# Patient Record
Sex: Male | Born: 1965 | Race: White | Hispanic: No | Smoking: Former smoker
Health system: Southern US, Community
[De-identification: ages and names within clinical notes are randomized; demographics above are authoritative.]

## PROBLEM LIST (undated history)

## (undated) DIAGNOSIS — N200 Calculus of kidney: Secondary | ICD-10-CM

## (undated) DIAGNOSIS — Z9289 Personal history of other medical treatment: Secondary | ICD-10-CM

## (undated) DIAGNOSIS — K219 Gastro-esophageal reflux disease without esophagitis: Secondary | ICD-10-CM

## (undated) DIAGNOSIS — N183 Chronic kidney disease, stage 3 unspecified: Secondary | ICD-10-CM

## (undated) DIAGNOSIS — I82402 Acute embolism and thrombosis of unspecified deep veins of left lower extremity: Secondary | ICD-10-CM

## (undated) DIAGNOSIS — C3490 Malignant neoplasm of unspecified part of unspecified bronchus or lung: Secondary | ICD-10-CM

## (undated) DIAGNOSIS — Z974 Presence of external hearing-aid: Secondary | ICD-10-CM

## (undated) DIAGNOSIS — I10 Essential (primary) hypertension: Secondary | ICD-10-CM

## (undated) DIAGNOSIS — M1990 Unspecified osteoarthritis, unspecified site: Secondary | ICD-10-CM

## (undated) DIAGNOSIS — D649 Anemia, unspecified: Secondary | ICD-10-CM

## (undated) HISTORY — DX: Gastro-esophageal reflux disease without esophagitis: K21.9

## (undated) HISTORY — DX: Malignant neoplasm of unspecified part of unspecified bronchus or lung: C34.90

## (undated) HISTORY — DX: Anemia, unspecified: D64.9

## (undated) HISTORY — PX: OTHER SURGICAL HISTORY: SHX169

## (undated) HISTORY — DX: Essential (primary) hypertension: I10

## (undated) HISTORY — PX: INGUINAL HERNIA REPAIR: SUR1180

## (undated) HISTORY — DX: Calculus of kidney: N20.0

---

## 2006-10-10 ENCOUNTER — Ambulatory Visit: Payer: Self-pay | Admitting: Emergency Medicine

## 2010-08-13 ENCOUNTER — Ambulatory Visit: Payer: Self-pay | Admitting: Internal Medicine

## 2013-03-02 ENCOUNTER — Ambulatory Visit: Payer: Self-pay | Admitting: Family Medicine

## 2013-03-14 ENCOUNTER — Ambulatory Visit: Payer: Self-pay | Admitting: Specialist

## 2013-03-14 LAB — CBC WITH DIFFERENTIAL/PLATELET
BASOS ABS: 0.1 10*3/uL (ref 0.0–0.1)
BASOS PCT: 0.9 %
EOS PCT: 0.3 %
Eosinophil #: 0 10*3/uL (ref 0.0–0.7)
HCT: 28 % — ABNORMAL LOW (ref 40.0–52.0)
HGB: 9 g/dL — ABNORMAL LOW (ref 13.0–18.0)
Lymphocyte #: 1.3 10*3/uL (ref 1.0–3.6)
Lymphocyte %: 10.5 %
MCH: 25.7 pg — ABNORMAL LOW (ref 26.0–34.0)
MCHC: 32.2 g/dL (ref 32.0–36.0)
MCV: 80 fL (ref 80–100)
Monocyte #: 1.4 x10 3/mm — ABNORMAL HIGH (ref 0.2–1.0)
Monocyte %: 11.5 %
NEUTROS PCT: 76.8 %
Neutrophil #: 9.4 10*3/uL — ABNORMAL HIGH (ref 1.4–6.5)
PLATELETS: 577 10*3/uL — AB (ref 150–440)
RBC: 3.51 10*6/uL — ABNORMAL LOW (ref 4.40–5.90)
RDW: 15.8 % — ABNORMAL HIGH (ref 11.5–14.5)
WBC: 12.3 10*3/uL — ABNORMAL HIGH (ref 3.8–10.6)

## 2013-03-14 LAB — PROTIME-INR
INR: 1.2
Prothrombin Time: 14.6 secs (ref 11.5–14.7)

## 2013-03-17 ENCOUNTER — Ambulatory Visit: Payer: Self-pay | Admitting: Oncology

## 2013-03-17 LAB — COMPREHENSIVE METABOLIC PANEL
ALBUMIN: 2.1 g/dL — AB (ref 3.4–5.0)
ALT: 15 U/L (ref 12–78)
ANION GAP: 10 (ref 7–16)
Alkaline Phosphatase: 209 U/L — ABNORMAL HIGH
BUN: 11 mg/dL (ref 7–18)
Bilirubin,Total: 0.3 mg/dL (ref 0.2–1.0)
Calcium, Total: 8.5 mg/dL (ref 8.5–10.1)
Chloride: 95 mmol/L — ABNORMAL LOW (ref 98–107)
Co2: 27 mmol/L (ref 21–32)
Creatinine: 1.07 mg/dL (ref 0.60–1.30)
EGFR (Non-African Amer.): >60
GLUCOSE: 119 mg/dL — AB (ref 65–99)
Osmolality: 265 (ref 275–301)
Potassium: 3.6 mmol/L (ref 3.5–5.1)
SGOT(AST): 20 U/L (ref 15–37)
Sodium: 132 mmol/L — ABNORMAL LOW (ref 136–145)
Total Protein: 7.9 g/dL (ref 6.4–8.2)

## 2013-03-17 LAB — CBC CANCER CENTER
BASOS PCT: 0.7 %
Basophil #: 0.1 x10 3/mm (ref 0.0–0.1)
EOS ABS: 0 x10 3/mm (ref 0.0–0.7)
Eosinophil %: 0.4 %
HCT: 28.4 % — AB (ref 40.0–52.0)
HGB: 9.2 g/dL — AB (ref 13.0–18.0)
Lymphocyte #: 1.2 x10 3/mm (ref 1.0–3.6)
Lymphocyte %: 12.3 %
MCH: 25.7 pg — AB (ref 26.0–34.0)
MCHC: 32.5 g/dL (ref 32.0–36.0)
MCV: 79 fL — ABNORMAL LOW (ref 80–100)
MONO ABS: 1.2 x10 3/mm — AB (ref 0.2–1.0)
Monocyte %: 12.2 %
NEUTROS ABS: 7.1 x10 3/mm — AB (ref 1.4–6.5)
Neutrophil %: 74.4 %
Platelet: 571 x10 3/mm — ABNORMAL HIGH (ref 150–440)
RBC: 3.58 10*6/uL — AB (ref 4.40–5.90)
RDW: 15.9 % — ABNORMAL HIGH (ref 11.5–14.5)
WBC: 9.6 x10 3/mm (ref 3.8–10.6)

## 2013-03-18 ENCOUNTER — Ambulatory Visit: Payer: Self-pay | Admitting: Specialist

## 2013-03-18 LAB — BETA HCG QUANT (REF LAB)

## 2013-03-18 LAB — CEA: CEA: 2.6 ng/mL (ref 0.0–4.7)

## 2013-03-18 LAB — AFP TUMOR MARKER: AFP-Tumor Marker: 3.4 ng/mL (ref 0.0–8.3)

## 2013-03-27 ENCOUNTER — Ambulatory Visit: Payer: Self-pay | Admitting: Oncology

## 2013-04-01 LAB — COMPREHENSIVE METABOLIC PANEL
ALBUMIN: 2.1 g/dL — AB (ref 3.4–5.0)
ALT: 12 U/L (ref 12–78)
Alkaline Phosphatase: 219 U/L — ABNORMAL HIGH
Anion Gap: 5 — ABNORMAL LOW (ref 7–16)
BUN: 12 mg/dL (ref 7–18)
Bilirubin,Total: 0.5 mg/dL (ref 0.2–1.0)
Calcium, Total: 9.3 mg/dL (ref 8.5–10.1)
Chloride: 97 mmol/L — ABNORMAL LOW (ref 98–107)
Co2: 28 mmol/L (ref 21–32)
Creatinine: 1.1 mg/dL (ref 0.60–1.30)
EGFR (African American): >60
EGFR (Non-African Amer.): >60
GLUCOSE: 108 mg/dL — AB (ref 65–99)
OSMOLALITY: 265 (ref 275–301)
POTASSIUM: 3.7 mmol/L (ref 3.5–5.1)
SGOT(AST): 19 U/L (ref 15–37)
Sodium: 132 mmol/L — ABNORMAL LOW (ref 136–145)
Total Protein: 8.4 g/dL — ABNORMAL HIGH (ref 6.4–8.2)

## 2013-04-01 LAB — APTT: Activated PTT: 40 secs — ABNORMAL HIGH (ref 23.6–35.9)

## 2013-04-01 LAB — CBC CANCER CENTER
Basophil #: 0.1 x10 3/mm (ref 0.0–0.1)
Basophil %: 0.9 %
Eosinophil #: 0 x10 3/mm (ref 0.0–0.7)
Eosinophil %: 0.1 %
HCT: 27 % — ABNORMAL LOW (ref 40.0–52.0)
HGB: 9.1 g/dL — ABNORMAL LOW (ref 13.0–18.0)
LYMPHS ABS: 1.4 x10 3/mm (ref 1.0–3.6)
Lymphocyte %: 12.6 %
MCH: 26.9 pg (ref 26.0–34.0)
MCHC: 33.8 g/dL (ref 32.0–36.0)
MCV: 80 fL (ref 80–100)
MONO ABS: 0.9 x10 3/mm (ref 0.2–1.0)
Monocyte %: 8.5 %
NEUTROS ABS: 8.5 x10 3/mm — AB (ref 1.4–6.5)
NEUTROS PCT: 77.9 %
Platelet: 604 x10 3/mm — ABNORMAL HIGH (ref 150–440)
RBC: 3.39 10*6/uL — ABNORMAL LOW (ref 4.40–5.90)
RDW: 16.2 % — AB (ref 11.5–14.5)
WBC: 10.9 x10 3/mm — ABNORMAL HIGH (ref 3.8–10.6)

## 2013-04-01 LAB — PROTIME-INR
INR: 1.2
PROTHROMBIN TIME: 14.6 s (ref 11.5–14.7)

## 2013-04-06 ENCOUNTER — Ambulatory Visit: Payer: Self-pay

## 2013-04-15 LAB — COMPREHENSIVE METABOLIC PANEL
ALBUMIN: 2.1 g/dL — AB (ref 3.4–5.0)
ALK PHOS: 262 U/L — AB
ALT: 14 U/L (ref 12–78)
Anion Gap: 13 (ref 7–16)
BUN: 14 mg/dL (ref 7–18)
Bilirubin,Total: 0.4 mg/dL (ref 0.2–1.0)
CHLORIDE: 94 mmol/L — AB (ref 98–107)
CO2: 25 mmol/L (ref 21–32)
Calcium, Total: 9.2 mg/dL (ref 8.5–10.1)
Creatinine: 1.04 mg/dL (ref 0.60–1.30)
GLUCOSE: 177 mg/dL — AB (ref 65–99)
OSMOLALITY: 269 (ref 275–301)
Potassium: 3.6 mmol/L (ref 3.5–5.1)
SGOT(AST): 14 U/L — ABNORMAL LOW (ref 15–37)
Sodium: 132 mmol/L — ABNORMAL LOW (ref 136–145)
Total Protein: 8.3 g/dL — ABNORMAL HIGH (ref 6.4–8.2)

## 2013-04-15 LAB — CBC CANCER CENTER
Basophil #: 0 x10 3/mm (ref 0.0–0.1)
Basophil %: 0.3 %
EOS ABS: 0 x10 3/mm (ref 0.0–0.7)
EOS PCT: 0 %
HCT: 27.4 % — ABNORMAL LOW (ref 40.0–52.0)
HGB: 8.8 g/dL — ABNORMAL LOW (ref 13.0–18.0)
Lymphocyte #: 0.8 x10 3/mm — ABNORMAL LOW (ref 1.0–3.6)
Lymphocyte %: 11.6 %
MCH: 25.2 pg — ABNORMAL LOW (ref 26.0–34.0)
MCHC: 32.1 g/dL (ref 32.0–36.0)
MCV: 78 fL — ABNORMAL LOW (ref 80–100)
MONOS PCT: 2.5 %
Monocyte #: 0.2 x10 3/mm (ref 0.2–1.0)
NEUTROS PCT: 85.6 %
Neutrophil #: 6 x10 3/mm (ref 1.4–6.5)
Platelet: 567 x10 3/mm — ABNORMAL HIGH (ref 150–440)
RBC: 3.49 10*6/uL — ABNORMAL LOW (ref 4.40–5.90)
RDW: 16.8 % — AB (ref 11.5–14.5)
WBC: 7.1 x10 3/mm (ref 3.8–10.6)

## 2013-04-15 LAB — FERRITIN: Ferritin (ARMC): 1093 ng/mL — ABNORMAL HIGH (ref 8–388)

## 2013-04-15 LAB — IRON AND TIBC
IRON SATURATION: 15 %
Iron Bind.Cap.(Total): 172 ug/dL — ABNORMAL LOW (ref 250–450)
Iron: 25 ug/dL — ABNORMAL LOW (ref 65–175)
UNBOUND IRON-BIND. CAP.: 147 ug/dL

## 2013-04-20 LAB — CBC CANCER CENTER
BASOS ABS: 0 x10 3/mm (ref 0.0–0.1)
BASOS PCT: 0.4 %
EOS ABS: 0.1 x10 3/mm (ref 0.0–0.7)
Eosinophil %: 0.9 %
HCT: 26.2 % — ABNORMAL LOW (ref 40.0–52.0)
HGB: 8.2 g/dL — AB (ref 13.0–18.0)
LYMPHS ABS: 0.6 x10 3/mm — AB (ref 1.0–3.6)
Lymphocyte %: 5.9 %
MCH: 24.4 pg — ABNORMAL LOW (ref 26.0–34.0)
MCHC: 31.5 g/dL — ABNORMAL LOW (ref 32.0–36.0)
MCV: 77 fL — ABNORMAL LOW (ref 80–100)
MONO ABS: 0.2 x10 3/mm (ref 0.2–1.0)
MONOS PCT: 1.7 %
NEUTROS ABS: 9.8 x10 3/mm — AB (ref 1.4–6.5)
NEUTROS PCT: 91.1 %
PLATELETS: 428 x10 3/mm (ref 150–440)
RBC: 3.38 10*6/uL — ABNORMAL LOW (ref 4.40–5.90)
RDW: 16.3 % — ABNORMAL HIGH (ref 11.5–14.5)
WBC: 10.7 x10 3/mm — ABNORMAL HIGH (ref 3.8–10.6)

## 2013-04-20 LAB — COMPREHENSIVE METABOLIC PANEL
ALK PHOS: 340 U/L — AB
Albumin: 2.2 g/dL — ABNORMAL LOW (ref 3.4–5.0)
Anion Gap: 12 (ref 7–16)
BUN: 18 mg/dL (ref 7–18)
Bilirubin,Total: 0.8 mg/dL (ref 0.2–1.0)
CALCIUM: 8.3 mg/dL — AB (ref 8.5–10.1)
CHLORIDE: 90 mmol/L — AB (ref 98–107)
Co2: 24 mmol/L (ref 21–32)
Creatinine: 1.24 mg/dL (ref 0.60–1.30)
EGFR (African American): >60
Glucose: 214 mg/dL — ABNORMAL HIGH (ref 65–99)
OSMOLALITY: 262 (ref 275–301)
POTASSIUM: 4.3 mmol/L (ref 3.5–5.1)
SGOT(AST): 22 U/L (ref 15–37)
SGPT (ALT): 21 U/L (ref 12–78)
SODIUM: 126 mmol/L — AB (ref 136–145)
TOTAL PROTEIN: 7.5 g/dL (ref 6.4–8.2)

## 2013-04-20 LAB — MAGNESIUM: Magnesium: 1.5 mg/dL — ABNORMAL LOW

## 2013-04-20 LAB — PATHOLOGY REPORT

## 2013-04-24 ENCOUNTER — Ambulatory Visit: Payer: Self-pay | Admitting: Oncology

## 2013-04-27 LAB — PATHOLOGY REPORT

## 2013-04-29 LAB — CBC CANCER CENTER
BASOS PCT: 0.9 %
Basophil #: 0.1 x10 3/mm (ref 0.0–0.1)
Eosinophil #: 0 x10 3/mm (ref 0.0–0.7)
Eosinophil %: 0.4 %
HCT: 22.8 % — ABNORMAL LOW (ref 40.0–52.0)
HGB: 7.2 g/dL — ABNORMAL LOW (ref 13.0–18.0)
Lymphocyte #: 1.9 x10 3/mm (ref 1.0–3.6)
Lymphocyte %: 17.7 %
MCH: 24 pg — ABNORMAL LOW (ref 26.0–34.0)
MCHC: 31.4 g/dL — ABNORMAL LOW (ref 32.0–36.0)
MCV: 76 fL — AB (ref 80–100)
MONO ABS: 1.7 x10 3/mm — AB (ref 0.2–1.0)
MONOS PCT: 15.4 %
NEUTROS PCT: 65.6 %
Neutrophil #: 7.1 x10 3/mm — ABNORMAL HIGH (ref 1.4–6.5)
PLATELETS: 585 x10 3/mm — AB (ref 150–440)
RBC: 3 10*6/uL — ABNORMAL LOW (ref 4.40–5.90)
RDW: 16.7 % — ABNORMAL HIGH (ref 11.5–14.5)
WBC: 10.9 x10 3/mm — ABNORMAL HIGH (ref 3.8–10.6)

## 2013-05-02 LAB — CBC CANCER CENTER
Basophil #: 0.1 x10 3/mm (ref 0.0–0.1)
Basophil %: 1 %
EOS PCT: 0.4 %
Eosinophil #: 0.1 x10 3/mm (ref 0.0–0.7)
HCT: 23.4 % — AB (ref 40.0–52.0)
HGB: 7.2 g/dL — ABNORMAL LOW (ref 13.0–18.0)
Lymphocyte #: 1.9 x10 3/mm (ref 1.0–3.6)
Lymphocyte %: 14.9 %
MCH: 23.4 pg — AB (ref 26.0–34.0)
MCHC: 30.6 g/dL — AB (ref 32.0–36.0)
MCV: 77 fL — AB (ref 80–100)
MONOS PCT: 10.3 %
Monocyte #: 1.3 x10 3/mm — ABNORMAL HIGH (ref 0.2–1.0)
NEUTROS ABS: 9.3 x10 3/mm — AB (ref 1.4–6.5)
NEUTROS PCT: 73.4 %
PLATELETS: 677 x10 3/mm — AB (ref 150–440)
RBC: 3.06 10*6/uL — ABNORMAL LOW (ref 4.40–5.90)
RDW: 17 % — ABNORMAL HIGH (ref 11.5–14.5)
WBC: 12.6 x10 3/mm — ABNORMAL HIGH (ref 3.8–10.6)

## 2013-05-13 LAB — MAGNESIUM: Magnesium: 2 mg/dL

## 2013-05-13 LAB — COMPREHENSIVE METABOLIC PANEL
ALBUMIN: 2.5 g/dL — AB (ref 3.4–5.0)
AST: 19 U/L (ref 15–37)
Alkaline Phosphatase: 225 U/L — ABNORMAL HIGH
Anion Gap: 9 (ref 7–16)
BUN: 16 mg/dL (ref 7–18)
Bilirubin,Total: 0.3 mg/dL (ref 0.2–1.0)
CO2: 28 mmol/L (ref 21–32)
CREATININE: 1.19 mg/dL (ref 0.60–1.30)
Calcium, Total: 9.1 mg/dL (ref 8.5–10.1)
Chloride: 102 mmol/L (ref 98–107)
EGFR (African American): >60
EGFR (Non-African Amer.): >60
Glucose: 138 mg/dL — ABNORMAL HIGH (ref 65–99)
OSMOLALITY: 281 (ref 275–301)
Potassium: 4 mmol/L (ref 3.5–5.1)
SGPT (ALT): 21 U/L (ref 12–78)
SODIUM: 139 mmol/L (ref 136–145)
Total Protein: 7.6 g/dL (ref 6.4–8.2)

## 2013-05-13 LAB — CBC CANCER CENTER
Basophil #: 0 x10 3/mm (ref 0.0–0.1)
Basophil %: 0.2 %
Eosinophil #: 0.1 x10 3/mm (ref 0.0–0.7)
Eosinophil %: 1.8 %
HCT: 28.8 % — AB (ref 40.0–52.0)
HGB: 9.1 g/dL — ABNORMAL LOW (ref 13.0–18.0)
LYMPHS PCT: 18.9 %
Lymphocyte #: 1.5 x10 3/mm (ref 1.0–3.6)
MCH: 25.2 pg — ABNORMAL LOW (ref 26.0–34.0)
MCHC: 31.6 g/dL — AB (ref 32.0–36.0)
MCV: 80 fL (ref 80–100)
Monocyte #: 0.7 x10 3/mm (ref 0.2–1.0)
Monocyte %: 8.5 %
NEUTROS ABS: 5.7 x10 3/mm (ref 1.4–6.5)
Neutrophil %: 70.6 %
PLATELETS: 438 x10 3/mm (ref 150–440)
RBC: 3.62 10*6/uL — ABNORMAL LOW (ref 4.40–5.90)
RDW: 18.9 % — ABNORMAL HIGH (ref 11.5–14.5)
WBC: 8.1 x10 3/mm (ref 3.8–10.6)

## 2013-05-16 LAB — CBC CANCER CENTER
BASOS ABS: 0 x10 3/mm (ref 0.0–0.1)
Basophil %: 0.1 %
EOS ABS: 0 x10 3/mm (ref 0.0–0.7)
Eosinophil %: 0 %
HCT: 30.6 % — AB (ref 40.0–52.0)
HGB: 9.6 g/dL — AB (ref 13.0–18.0)
Lymphocyte #: 0.9 x10 3/mm — ABNORMAL LOW (ref 1.0–3.6)
Lymphocyte %: 12.7 %
MCH: 25.1 pg — AB (ref 26.0–34.0)
MCHC: 31.4 g/dL — ABNORMAL LOW (ref 32.0–36.0)
MCV: 80 fL (ref 80–100)
MONOS PCT: 2.1 %
Monocyte #: 0.2 x10 3/mm (ref 0.2–1.0)
NEUTROS PCT: 85.1 %
Neutrophil #: 6.2 x10 3/mm (ref 1.4–6.5)
Platelet: 393 x10 3/mm (ref 150–440)
RBC: 3.83 10*6/uL — ABNORMAL LOW (ref 4.40–5.90)
RDW: 19.4 % — ABNORMAL HIGH (ref 11.5–14.5)
WBC: 7.3 x10 3/mm (ref 3.8–10.6)

## 2013-05-16 LAB — COMPREHENSIVE METABOLIC PANEL
ALBUMIN: 2.7 g/dL — AB (ref 3.4–5.0)
ALK PHOS: 219 U/L — AB
ANION GAP: 10 (ref 7–16)
BILIRUBIN TOTAL: 0.2 mg/dL (ref 0.2–1.0)
BUN: 16 mg/dL (ref 7–18)
CALCIUM: 9.2 mg/dL (ref 8.5–10.1)
CHLORIDE: 100 mmol/L (ref 98–107)
Co2: 28 mmol/L (ref 21–32)
Creatinine: 0.99 mg/dL (ref 0.60–1.30)
EGFR (African American): >60
EGFR (Non-African Amer.): >60
GLUCOSE: 203 mg/dL — AB (ref 65–99)
OSMOLALITY: 283 (ref 275–301)
Potassium: 4.1 mmol/L (ref 3.5–5.1)
SGOT(AST): 20 U/L (ref 15–37)
SGPT (ALT): 21 U/L (ref 12–78)
SODIUM: 138 mmol/L (ref 136–145)
TOTAL PROTEIN: 7.7 g/dL (ref 6.4–8.2)

## 2013-05-25 ENCOUNTER — Ambulatory Visit: Payer: Self-pay | Admitting: Oncology

## 2013-05-25 LAB — CBC CANCER CENTER
BASOS ABS: 0 x10 3/mm (ref 0.0–0.1)
Basophil %: 0.4 %
Eosinophil #: 0 x10 3/mm (ref 0.0–0.7)
Eosinophil %: 0.5 %
HCT: 24.9 % — ABNORMAL LOW (ref 40.0–52.0)
HGB: 7.9 g/dL — ABNORMAL LOW (ref 13.0–18.0)
Lymphocyte #: 1.1 x10 3/mm (ref 1.0–3.6)
Lymphocyte %: 14.8 %
MCH: 25.6 pg — AB (ref 26.0–34.0)
MCHC: 31.9 g/dL — AB (ref 32.0–36.0)
MCV: 81 fL (ref 80–100)
Monocyte #: 0.4 x10 3/mm (ref 0.2–1.0)
Monocyte %: 4.8 %
NEUTROS ABS: 5.9 x10 3/mm (ref 1.4–6.5)
NEUTROS PCT: 79.5 %
Platelet: 179 x10 3/mm (ref 150–440)
RBC: 3.1 10*6/uL — AB (ref 4.40–5.90)
RDW: 20.1 % — ABNORMAL HIGH (ref 11.5–14.5)
WBC: 7.5 x10 3/mm (ref 3.8–10.6)

## 2013-06-01 LAB — CBC CANCER CENTER
BASOS ABS: 0 x10 3/mm (ref 0.0–0.1)
Basophil %: 0.7 %
Eosinophil #: 0 x10 3/mm (ref 0.0–0.7)
Eosinophil %: 0.4 %
HCT: 28.5 % — AB (ref 40.0–52.0)
HGB: 9 g/dL — ABNORMAL LOW (ref 13.0–18.0)
Lymphocyte #: 1.3 x10 3/mm (ref 1.0–3.6)
Lymphocyte %: 53.3 %
MCH: 26.2 pg (ref 26.0–34.0)
MCHC: 31.6 g/dL — AB (ref 32.0–36.0)
MCV: 83 fL (ref 80–100)
MONOS PCT: 9.7 %
Monocyte #: 0.2 x10 3/mm (ref 0.2–1.0)
NEUTROS ABS: 0.9 x10 3/mm — AB (ref 1.4–6.5)
Neutrophil %: 35.9 %
Platelet: 183 x10 3/mm (ref 150–440)
RBC: 3.44 10*6/uL — AB (ref 4.40–5.90)
RDW: 20.5 % — AB (ref 11.5–14.5)
WBC: 2.4 x10 3/mm — ABNORMAL LOW (ref 3.8–10.6)

## 2013-06-08 LAB — COMPREHENSIVE METABOLIC PANEL
ALK PHOS: 143 U/L — AB
AST: 20 U/L (ref 15–37)
Albumin: 2.7 g/dL — ABNORMAL LOW (ref 3.4–5.0)
Anion Gap: 10 (ref 7–16)
BILIRUBIN TOTAL: 0.2 mg/dL (ref 0.2–1.0)
BUN: 13 mg/dL (ref 7–18)
CHLORIDE: 105 mmol/L (ref 98–107)
CO2: 29 mmol/L (ref 21–32)
Calcium, Total: 8.8 mg/dL (ref 8.5–10.1)
Creatinine: 1.08 mg/dL (ref 0.60–1.30)
EGFR (Non-African Amer.): >60
GLUCOSE: 110 mg/dL — AB (ref 65–99)
OSMOLALITY: 288 (ref 275–301)
POTASSIUM: 3.8 mmol/L (ref 3.5–5.1)
SGPT (ALT): 14 U/L (ref 12–78)
Sodium: 144 mmol/L (ref 136–145)
TOTAL PROTEIN: 6.6 g/dL (ref 6.4–8.2)

## 2013-06-08 LAB — CBC CANCER CENTER
BASOS ABS: 0.1 x10 3/mm (ref 0.0–0.1)
BASOS PCT: 2 %
EOS ABS: 0.1 x10 3/mm (ref 0.0–0.7)
Eosinophil %: 1.7 %
HCT: 29.5 % — ABNORMAL LOW (ref 40.0–52.0)
HGB: 9.5 g/dL — AB (ref 13.0–18.0)
LYMPHS ABS: 1.8 x10 3/mm (ref 1.0–3.6)
LYMPHS PCT: 30.1 %
MCH: 27.7 pg (ref 26.0–34.0)
MCHC: 32 g/dL (ref 32.0–36.0)
MCV: 87 fL (ref 80–100)
MONOS PCT: 12.8 %
Monocyte #: 0.7 x10 3/mm (ref 0.2–1.0)
NEUTROS ABS: 3.1 x10 3/mm (ref 1.4–6.5)
NEUTROS PCT: 53.4 %
Platelet: 506 x10 3/mm — ABNORMAL HIGH (ref 150–440)
RBC: 3.41 10*6/uL — ABNORMAL LOW (ref 4.40–5.90)
RDW: 24.5 % — ABNORMAL HIGH (ref 11.5–14.5)
WBC: 5.8 x10 3/mm (ref 3.8–10.6)

## 2013-06-15 LAB — CBC CANCER CENTER
BASOS PCT: 1.7 %
Basophil #: 0.1 x10 3/mm (ref 0.0–0.1)
EOS ABS: 0 x10 3/mm (ref 0.0–0.7)
Eosinophil %: 0.8 %
HCT: 30.4 % — ABNORMAL LOW (ref 40.0–52.0)
HGB: 9.8 g/dL — ABNORMAL LOW (ref 13.0–18.0)
LYMPHS ABS: 1.7 x10 3/mm (ref 1.0–3.6)
LYMPHS PCT: 28.8 %
MCH: 27.8 pg (ref 26.0–34.0)
MCHC: 32.1 g/dL (ref 32.0–36.0)
MCV: 87 fL (ref 80–100)
MONO ABS: 0.2 x10 3/mm (ref 0.2–1.0)
Monocyte %: 4.1 %
Neutrophil #: 3.8 x10 3/mm (ref 1.4–6.5)
Neutrophil %: 64.6 %
Platelet: 345 x10 3/mm (ref 150–440)
RBC: 3.52 10*6/uL — ABNORMAL LOW (ref 4.40–5.90)
RDW: 24.6 % — ABNORMAL HIGH (ref 11.5–14.5)
WBC: 5.9 x10 3/mm (ref 3.8–10.6)

## 2013-06-24 ENCOUNTER — Ambulatory Visit: Payer: Self-pay | Admitting: Oncology

## 2013-06-29 LAB — CBC CANCER CENTER
BASOS ABS: 0 x10 3/mm (ref 0.0–0.1)
BASOS PCT: 0.8 %
EOS ABS: 0.1 x10 3/mm (ref 0.0–0.7)
Eosinophil %: 1.2 %
HCT: 30.1 % — AB (ref 40.0–52.0)
HGB: 9.8 g/dL — ABNORMAL LOW (ref 13.0–18.0)
LYMPHS ABS: 1.3 x10 3/mm (ref 1.0–3.6)
LYMPHS PCT: 26.6 %
MCH: 30.5 pg (ref 26.0–34.0)
MCHC: 32.7 g/dL (ref 32.0–36.0)
MCV: 94 fL (ref 80–100)
Monocyte #: 0.9 x10 3/mm (ref 0.2–1.0)
Monocyte %: 18.3 %
NEUTROS PCT: 53.1 %
Neutrophil #: 2.7 x10 3/mm (ref 1.4–6.5)
PLATELETS: 254 x10 3/mm (ref 150–440)
RBC: 3.22 10*6/uL — ABNORMAL LOW (ref 4.40–5.90)
RDW: 28.8 % — AB (ref 11.5–14.5)
WBC: 5.1 x10 3/mm (ref 3.8–10.6)

## 2013-06-29 LAB — COMPREHENSIVE METABOLIC PANEL
ALBUMIN: 3.3 g/dL — AB (ref 3.4–5.0)
ALT: 16 U/L (ref 12–78)
Alkaline Phosphatase: 111 U/L
Anion Gap: 5 — ABNORMAL LOW (ref 7–16)
BUN: 9 mg/dL (ref 7–18)
Bilirubin,Total: 0.4 mg/dL (ref 0.2–1.0)
CO2: 30 mmol/L (ref 21–32)
Calcium, Total: 8.2 mg/dL — ABNORMAL LOW (ref 8.5–10.1)
Chloride: 103 mmol/L (ref 98–107)
Creatinine: 1.22 mg/dL (ref 0.60–1.30)
EGFR (Non-African Amer.): >60
GLUCOSE: 117 mg/dL — AB (ref 65–99)
Osmolality: 275 (ref 275–301)
Potassium: 4.2 mmol/L (ref 3.5–5.1)
SGOT(AST): 20 U/L (ref 15–37)
SODIUM: 138 mmol/L (ref 136–145)
Total Protein: 6.6 g/dL (ref 6.4–8.2)

## 2013-06-30 DIAGNOSIS — C3410 Malignant neoplasm of upper lobe, unspecified bronchus or lung: Secondary | ICD-10-CM | POA: Insufficient documentation

## 2013-07-01 ENCOUNTER — Encounter: Payer: Self-pay | Admitting: Cardiothoracic Surgery

## 2013-07-01 ENCOUNTER — Institutional Professional Consult (permissible substitution) (INDEPENDENT_AMBULATORY_CARE_PROVIDER_SITE_OTHER): Payer: 59 | Admitting: Cardiothoracic Surgery

## 2013-07-01 VITALS — BP 133/86 | HR 64 | Resp 16 | Ht 66.0 in | Wt 180.0 lb

## 2013-07-01 DIAGNOSIS — Z9221 Personal history of antineoplastic chemotherapy: Secondary | ICD-10-CM

## 2013-07-01 DIAGNOSIS — Z5189 Encounter for other specified aftercare: Secondary | ICD-10-CM

## 2013-07-01 DIAGNOSIS — C3410 Malignant neoplasm of upper lobe, unspecified bronchus or lung: Secondary | ICD-10-CM

## 2013-07-03 NOTE — Progress Notes (Signed)
[address]       [place] [address]             [phone]                    [name] Medical Record #[id number] Date of Birth: [birth year]  Referring: Nestor Lewandowsky, MD Primary Care: No primary provider on file.  Chief Complaint:    Chief Complaint  Patient presents with  . Lung Cancer    RULobe...has had chemotherapy...eval for surgery vs radiation    History of Present Illness:    Karl Wise 48 y.o. male is seen in the office  today for evaluation of possible surgical resection. In dec 2014 patient noted increasing cough. Chest xray and CT  Showed large right upper chest mass 9cm  with question of mediastinal invasion. Bx was preformed. He has completed 3 cycles of chemo. Paclitaxel had to be stopped after 2 round due to allergic reaction. Follow up ct shows decrease in size of mass but suggestion of two new new lesions in the liver. Functionally patient remains active, working in [employer]. He is a none smoker and has no known occupational exposures.   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   Zubrod Score: At the time of surgery this patient's most appropriate activity status/level should be described as: _0     0    Normal activity, no symptoms _1     1    Restricted in physical strenuous activity but ambulatory, able to do out light work _2     2    Ambulatory and capable of self care, unable to do work activities, up and about               >50 % of waking hours                              _3     3    Only limited self care, in bed greater than 50% of waking hours _4     4    Completely disabled, no self care, confined to bed or chair _5     5    Moribund   Past Medical History  Diagnosis Date  . Lung cancer     T3,N0,M0, CT-guided needle BX's positive for poorly  differentiated carcinoma  . GERD (gastroesophageal reflux disease)   . Kidney stones   . Anemia   . Hypertension     Past Surgical History  Procedure Laterality Date  . Cyst removed off of right side of neck    . Inguinal hernia repair    . Port-a-cath placement       family history : father died 58 "heart Disease" and history of DM. Mother died in 51's heart disease, only child no children    History   Social History  . Marital Status: Married    Spouse Name: N/A    Number of Children: N/A  . 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 Smoking status: Former Smoker -- 1.00 packs/day for 20 years    Types: Cigarettes    Quit date: 03/02/2005  . Smokeless tobacco: Never Used  . Alcohol  Use: No  . Drug Use: No  . Sexual Activity: Not on file   Other Topics Concern  . Not on file   Social History Narrative  . No narrative on file    History  Smoking status  . Former Smoker -- 1.00 packs/day for 20 years  . Types: Cigarettes  . Quit date: 03/02/2005  Smokeless tobacco  . Never Used    History  Alcohol Use No     Allergies  Allergen Reactions  . Emend [Aprepitant] Anaphylaxis  . Paclitaxel Anaphylaxis    Current Outpatient Prescriptions  Medication Sig Dispense Refill  . hydrochlorothiazide (HYDRODIURIL) 25 MG tablet Take 25 mg by mouth daily.      . meloxicam (MOBIC) 7.5 MG tablet Take 7.5 mg by mouth daily.      . nebivolol (BYSTOLIC) 5 MG tablet Take 5 mg by mouth daily.       No current facility-administered medications for this visit.     Review of Systems:     Cardiac Review of Systems: Y or N  Chest Pain Florencio.Farrier    ]  Resting SOB [ n  ] Exertional SOB  Blue.Reese  ]  Orthopnea Florencio.Farrier  ]   Pedal Edema [  y ]    Palpitations [n  ] Syncope  Florencio.Farrier  ]   Presyncope [ n  ]  General Review of Systems: [Y] = yes [  ]=no Constitional: recent weight change Blue.Reese  ];  Wt loss over the last 3 months [  20  ] anorexia Blue.Reese  ]; fatigue Blue.Reese  ]; nausea [  ]; night sweats [ n ]; fever [ n ]; or chills [ n ];          Dental: poor dentition[n  ]; Last Dentist visit:   Eye : blurred vision [  ]; diplopia [   ]; vision changes [  ];  Amaurosis fugax[  ]; Resp: cough [  ];  wheezing[  n];  hemoptysis[ n ]; shortness of breath[ n ]; paroxysmal nocturnal dyspnea[ n ]; dyspnea on exertion[  ]; or orthopnea[  ];  GI:  gallstones[  ], vomiting[  ];  dysphagia[  ]; melena[  ];  hematochezia [  ]; heartburn[  ];   Hx of  Colonoscopy[ y ]; GU: kidney stones [  ]; hematuria[  ];   dysuria [  ];  nocturia[  ];  history of     obstruction [  ]; urinary frequency [  ]             Skin: rash, swelling[  ];, hair loss[  ];  peripheral edema[  ];  or itching[  ]; Musculosketetal: myalgias[  ];  joint swelling[  ];  joint erythema[  ];  joint pain[  ];  back pain[  ];  Heme/Lymph: bruising[  ];  bleeding[  ];  anemia[  ];  Neuro: TIA[  ];  headaches[  ];  stroke[  ];  vertigo[  ];  seizures[  ];   paresthesias[  ];  difficulty walking[y  ];  Psych:depression[  ]; anxiety[  ];  Endocrine: diabetes[ n ];  thyroid dysfunction[ n ];  Immunizations: Flu up to date [ y ]; Pneumococcal up to date [ y ];  Other:  Physical Exam: BP 133/86  Pulse 64  Resp 16  Ht _0  (1.676 m)  Wt 180 lb (81.647 kg)  BMI 29.07 kg/m2  SpO2 98%  PHYSICAL EXAMINATION:  General appearance: alert, cooperative, appears stated age and no distress Neurologic: intact Heart: regular rate and rhythm, S1, S2 normal, no murmur, click, rub or gallop Lungs: clear to auscultation bilaterally Abdomen: soft, non-tender; bowel sounds normal; no masses,  no organomegaly Extremities: extremities normal, atraumatic, no cyanosis or edema and Homans sign is negative, no sign of DVT no cervical or axillary or subclavicular adenopthy    Diagnostic Studies & Laboratory data:     Recent Radiology Findings:  CLINICAL DATA: Followup lung mass  EXAM: CT  CHEST WITH CONTRAST  TECHNIQUE: Multidetector CT imaging of the chest was performed during intravenous contrast administration.  CONTRAST: 75 cc of Isovue 370  COMPARISON: 03/02/2013  FINDINGS: Right upper lobe lung mass measures 3.9 cm, image 14/ series 2 previously 8.6 cm. There is no pleural effusion. 3 mm right upper lobe nodule is new from previous exam, image 36/series 3. The left lung is clear. Normal heart size. No pericardial effusion. No enlarged mediastinal or hilar lymph nodes. There is no axillary or supraclavicular lymph nodes. Incidental imaging through the upper abdomen is on unremarkable. New focus of low attenuation within the lateral segment of left hepatic lobe measures 8 mm, image 55/series 2. Also new from previous exam is a 6 mm low attenuation structure along the dome of the liver. The adrenal glands are both normal. Review of the visualized bony structures is on unremarkable. No aggressive lytic or sclerotic bone lesions.  IMPRESSION: 1. Decrease in size of right upper lobe lung mass. 2. There are 2 new low attenuation structures within the lateral segment of left hepatic lobe. Suspicious for liver metastasis.   Electronically Signed By: Kerby Moors M.D. 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 - Discussed surgical resection with patient, may not be able to determine resectable with out surgical intervention. Would continue with planned fourth cycle of chemo next week. Will arrange for MRI of the liver to evaluate for poss liver mets. Will see patient back in 1-2 weeks after scan. If proceed with surgical intervention would be after  recover period from 4 cycle of chem. Patient is agreeable with this approach.       I spent 45 minutes counseling the patient face to face. The total time spent in the appointment was 60 minutes.  Grace Isaac MD      [address] [place] [address] Office [phone]   Beeper [phone]  07/03/2013 7:33 PM

## 2013-07-04 ENCOUNTER — Other Ambulatory Visit: Payer: Self-pay | Admitting: *Deleted

## 2013-07-04 DIAGNOSIS — J984 Other disorders of lung: Secondary | ICD-10-CM

## 2013-07-06 LAB — CBC CANCER CENTER
BASOS PCT: 1.5 %
Basophil #: 0.2 x10 3/mm — ABNORMAL HIGH (ref 0.0–0.1)
EOS ABS: 0.1 x10 3/mm (ref 0.0–0.7)
EOS PCT: 0.9 %
HCT: 35.1 % — AB (ref 40.0–52.0)
HGB: 11.4 g/dL — ABNORMAL LOW (ref 13.0–18.0)
LYMPHS PCT: 16.7 %
Lymphocyte #: 1.8 x10 3/mm (ref 1.0–3.6)
MCH: 30.8 pg (ref 26.0–34.0)
MCHC: 32.5 g/dL (ref 32.0–36.0)
MCV: 95 fL (ref 80–100)
MONO ABS: 2 x10 3/mm — AB (ref 0.2–1.0)
Monocyte %: 19 %
Neutrophil #: 6.6 x10 3/mm — ABNORMAL HIGH (ref 1.4–6.5)
Neutrophil %: 61.9 %
PLATELETS: 359 x10 3/mm (ref 150–440)
RBC: 3.7 10*6/uL — AB (ref 4.40–5.90)
RDW: 26.9 % — AB (ref 11.5–14.5)
WBC: 10.6 x10 3/mm (ref 3.8–10.6)

## 2013-07-06 LAB — COMPREHENSIVE METABOLIC PANEL
ALBUMIN: 3.1 g/dL — AB (ref 3.4–5.0)
ALK PHOS: 122 U/L — AB
ANION GAP: 10 (ref 7–16)
AST: 13 U/L — AB (ref 15–37)
BILIRUBIN TOTAL: 0.5 mg/dL (ref 0.2–1.0)
BUN: 15 mg/dL (ref 7–18)
CALCIUM: 8.8 mg/dL (ref 8.5–10.1)
CHLORIDE: 103 mmol/L (ref 98–107)
Co2: 26 mmol/L (ref 21–32)
Creatinine: 1.27 mg/dL (ref 0.60–1.30)
EGFR (Non-African Amer.): >60
Glucose: 93 mg/dL (ref 65–99)
Osmolality: 278 (ref 275–301)
Potassium: 4.2 mmol/L (ref 3.5–5.1)
SGPT (ALT): 11 U/L — ABNORMAL LOW (ref 12–78)
Sodium: 139 mmol/L (ref 136–145)
Total Protein: 7 g/dL (ref 6.4–8.2)

## 2013-07-12 ENCOUNTER — Telehealth: Payer: Self-pay | Admitting: *Deleted

## 2013-07-12 NOTE — Telephone Encounter (Signed)
Received a call from Melinda Crutch, the [facility] oncology navigator, updating the status for [employer].  Dr. Bryson Ha is going to arrange for the MRI of the liver after his last chemo treatment since it is closer to where he lives.  I will cancel the one we arranged at GI.  I will arrange f/u with Dr. Servando Snare when appropriate.  I called Mr. Stick also.

## 2013-07-13 LAB — CBC CANCER CENTER
BASOS ABS: 0.1 x10 3/mm (ref 0.0–0.1)
BASOS PCT: 2.1 %
Eosinophil #: 0 x10 3/mm (ref 0.0–0.7)
Eosinophil %: 0.8 %
HCT: 32.8 % — AB (ref 40.0–52.0)
HGB: 10.9 g/dL — ABNORMAL LOW (ref 13.0–18.0)
LYMPHS ABS: 1.4 x10 3/mm (ref 1.0–3.6)
Lymphocyte %: 26.1 %
MCH: 30.9 pg (ref 26.0–34.0)
MCHC: 33.2 g/dL (ref 32.0–36.0)
MCV: 93 fL (ref 80–100)
MONO ABS: 0.4 x10 3/mm (ref 0.2–1.0)
Monocyte %: 7.8 %
NEUTROS PCT: 63.2 %
Neutrophil #: 3.3 x10 3/mm (ref 1.4–6.5)
Platelet: 273 x10 3/mm (ref 150–440)
RBC: 3.52 10*6/uL — ABNORMAL LOW (ref 4.40–5.90)
RDW: 22.4 % — AB (ref 11.5–14.5)
WBC: 5.3 x10 3/mm (ref 3.8–10.6)

## 2013-07-19 ENCOUNTER — Other Ambulatory Visit: Payer: 59

## 2013-07-19 ENCOUNTER — Ambulatory Visit: Payer: 59 | Admitting: Cardiothoracic Surgery

## 2013-07-19 NOTE — Progress Notes (Signed)
This encounter was created in error - please disregard.

## 2013-07-19 NOTE — Progress Notes (Deleted)
National Record #[id number] Date of Birth: 05-07-1965  Referring: Karl Lewandowsky, MD Primary Care: No primary provider on file.  Chief Complaint:    No chief complaint on file.   History of Present Illness:    Karl Wise 48 y.o. male is seen in the office  today for evaluation of possible surgical resection. In dec 2014 patient noted increasing cough. Chest xray and CT  Showed large right upper chest mass 9cm  with question of mediastinal invasion. Bx was preformed. He has completed 3 cycles of chemo. Paclitaxel had to be stopped after 2 round due to allergic reaction. Follow up ct shows decrease in size of mass but suggestion of two new new lesions in the liver. Functionally patient remains active, working in [employer]. He is a none smoker and has no known occupational exposures.   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   Zubrod Score: At the time of surgery this patient's most appropriate activity status/level should be described as: [x]    0    Normal activity, no symptoms []    1    Restricted in physical strenuous activity but ambulatory, able to do out light work []    2    Ambulatory and capable of self care, unable to do work activities, up and about               >50 % of waking hours                              []    3    Only limited self care, in bed greater than 50% of waking hours []    4    Completely disabled, no self care, confined to bed or chair []    5    Moribund   Past Medical History  Diagnosis Date  . Lung cancer     T3,N0,M0, CT-guided needle BX's positive for poorly differentiated carcinoma  . GERD (gastroesophageal reflux disease)   . Kidney stones   . Anemia   . Hypertension     Past Surgical History  Procedure Laterality Date  . Cyst  removed off of right side of neck    . Inguinal hernia repair    . Port-a-cath placement       family history : father died 33 "heart Disease" and history of DM. Mother died in 13's heart disease, only child no children    History   Social History  . Marital Status: Married    Spouse Name: N/A    Number of Children: N/A  . 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 Smoking status: Former Smoker -- 1.00 packs/day for 20 years    Types: Cigarettes    Quit date: 03/02/2005  . Smokeless tobacco: Never Used  . Alcohol Use: No  . Drug Use: No  . Sexual Activity: Not on file   Other Topics Concern  . Not on file   Social History Narrative  . No narrative on file    History  Smoking  status  . Former Smoker -- 1.00 packs/day for 20 years  . Types: Cigarettes  . Quit date: 03/02/2005  Smokeless tobacco  . Never Used    History  Alcohol Use No     Allergies  Allergen Reactions  . Emend [Aprepitant] Anaphylaxis  . Paclitaxel Anaphylaxis    Current Outpatient Prescriptions  Medication Sig Dispense Refill  . hydrochlorothiazide (HYDRODIURIL) 25 MG tablet Take 25 mg by mouth daily.      . meloxicam (MOBIC) 7.5 MG tablet Take 7.5 mg by mouth daily.      . nebivolol (BYSTOLIC) 5 MG tablet Take 5 mg by mouth daily.       No current facility-administered medications for this visit.     Review of Systems:     Cardiac Review of Systems: Y or N  Chest Pain Florencio.Farrier    ]  Resting SOB [ n  ] Exertional SOB  Blue.Reese  ]  Orthopnea Florencio.Farrier  ]   Pedal Edema [  y ]    Palpitations [n  ] Syncope  Florencio.Farrier  ]   Presyncope [ n  ]  General Review of Systems: [Y] = yes [  ]=no Constitional: recent weight change Blue.Reese  ];  Wt loss over the last 3 months [ 20  ] anorexia Blue.Reese  ]; fatigue Blue.Reese  ]; nausea [  ]; night sweats [ n ]; fever [ n ]; or chills [ n ];          Dental: poor dentition[n  ]; Last Dentist visit:   Eye : blurred vision [   ]; diplopia [   ]; vision changes [  ];  Amaurosis fugax[  ]; Resp: cough [  ];  wheezing[  n];  hemoptysis[ n ]; shortness of breath[ n ]; paroxysmal nocturnal dyspnea[ n ]; dyspnea on exertion[  ]; or orthopnea[  ];  GI:  gallstones[  ], vomiting[  ];  dysphagia[  ]; melena[  ];  hematochezia [  ]; heartburn[  ];   Hx of  Colonoscopy[ y ]; GU: kidney stones [  ]; hematuria[  ];   dysuria [  ];  nocturia[  ];  history of     obstruction [  ]; urinary frequency [  ]             Skin: rash, swelling[  ];, hair loss[  ];  peripheral edema[  ];  or itching[  ]; Musculosketetal: myalgias[  ];  joint swelling[  ];  joint erythema[  ];  joint pain[  ];  back pain[  ];  Heme/Lymph: bruising[  ];  bleeding[  ];  anemia[  ];  Neuro: TIA[  ];  headaches[  ];  stroke[  ];  vertigo[  ];  seizures[  ];   paresthesias[  ];  difficulty walking[y  ];  Psych:depression[  ]; anxiety[  ];  Endocrine: diabetes[ n ];  thyroid dysfunction[ n ];  Immunizations: Flu up to date [ y ]; Pneumococcal up to date [ y ];  Other:  Physical Exam: There were no vitals taken for this visit.  PHYSICAL EXAMINATION:  General appearance: alert, cooperative, appears stated age and no distress Neurologic: intact Heart: regular rate and rhythm, S1, S2 normal, no murmur, click, rub or gallop Lungs: clear to auscultation bilaterally Abdomen: soft, non-tender; bowel sounds normal; no masses,  no organomegaly Extremities: extremities normal, atraumatic, no cyanosis or edema and Homans sign is negative, no sign of DVT no  cervical or axillary or subclavicular adenopthy    Diagnostic Studies & Laboratory data:     Recent Radiology Findings:  CLINICAL DATA: Followup lung mass  EXAM: CT CHEST WITH CONTRAST  TECHNIQUE: Multidetector CT imaging of the chest was performed during intravenous contrast administration.  CONTRAST: 75 cc of Isovue 370  COMPARISON: 03/02/2013  FINDINGS: Right upper lobe lung mass measures 3.9 cm,  image 14/ series 2 previously 8.6 cm. There is no pleural effusion. 3 mm right upper lobe nodule is new from previous exam, image 36/series 3. The left lung is clear. Normal heart size. No pericardial effusion. No enlarged mediastinal or hilar lymph nodes. There is no axillary or supraclavicular lymph nodes. Incidental imaging through the upper abdomen is on unremarkable. New focus of low attenuation within the lateral segment of left hepatic lobe measures 8 mm, image 55/series 2. Also new from previous exam is a 6 mm low attenuation structure along the dome of the liver. The adrenal glands are both normal. Review of the visualized bony structures is on unremarkable. No aggressive lytic or sclerotic bone lesions.  IMPRESSION: 1. Decrease in size of right upper lobe lung mass. 2. There are 2 new low attenuation structures within the lateral segment of left hepatic lobe. Suspicious for liver metastasis.   Electronically Signed By: Kerby Moors M.D. 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 - Discussed surgical resection with patient, may not be able to determine resectable with out surgical intervention. Would continue with planned fourth cycle of chemo next week. Will arrange for MRI of the liver to evaluate for poss liver mets. Will see patient back in 1-2 weeks after scan. If proceed with surgical intervention would be after recover period from 4 cycle of chem. Patient is agreeable with this approach.       I spent 45 minutes counseling the patient face to face. The total time spent in the appointment was 60 minutes.  Grace Isaac MD       [address] [place] [address] Office [phone]   Beeper [phone]  07/19/2013 3:53 PM

## 2013-07-20 LAB — CBC CANCER CENTER
Basophil #: 0.1 x10 3/mm (ref 0.0–0.1)
Basophil %: 4.1 %
Eosinophil #: 0 x10 3/mm (ref 0.0–0.7)
Eosinophil %: 1.9 %
HCT: 29.4 % — ABNORMAL LOW (ref 40.0–52.0)
HGB: 9.8 g/dL — ABNORMAL LOW (ref 13.0–18.0)
LYMPHS ABS: 0.9 x10 3/mm — AB (ref 1.0–3.6)
Lymphocyte %: 48 %
MCH: 32.1 pg (ref 26.0–34.0)
MCHC: 33.2 g/dL (ref 32.0–36.0)
MCV: 97 fL (ref 80–100)
Monocyte #: 0 x10 3/mm — ABNORMAL LOW (ref 0.2–1.0)
Monocyte %: 2.5 %
Neutrophil #: 0.8 x10 3/mm — ABNORMAL LOW (ref 1.4–6.5)
Neutrophil %: 43.5 %
Platelet: 75 x10 3/mm — ABNORMAL LOW (ref 150–440)
RBC: 3.04 10*6/uL — ABNORMAL LOW (ref 4.40–5.90)
RDW: 20 % — AB (ref 11.5–14.5)
WBC: 1.8 x10 3/mm — CL (ref 3.8–10.6)

## 2013-07-25 ENCOUNTER — Ambulatory Visit: Payer: Self-pay | Admitting: Oncology

## 2013-08-11 ENCOUNTER — Ambulatory Visit (INDEPENDENT_AMBULATORY_CARE_PROVIDER_SITE_OTHER): Payer: 59 | Admitting: Cardiothoracic Surgery

## 2013-08-11 ENCOUNTER — Encounter: Payer: Self-pay | Admitting: Cardiothoracic Surgery

## 2013-08-11 VITALS — BP 148/87 | HR 63 | Resp 20 | Ht 66.0 in | Wt 180.0 lb

## 2013-08-11 DIAGNOSIS — I82419 Acute embolism and thrombosis of unspecified femoral vein: Secondary | ICD-10-CM | POA: Insufficient documentation

## 2013-08-11 DIAGNOSIS — Z7901 Long term (current) use of anticoagulants: Secondary | ICD-10-CM

## 2013-08-11 DIAGNOSIS — C3410 Malignant neoplasm of upper lobe, unspecified bronchus or lung: Secondary | ICD-10-CM

## 2013-08-11 DIAGNOSIS — I824Y9 Acute embolism and thrombosis of unspecified deep veins of unspecified proximal lower extremity: Secondary | ICD-10-CM

## 2013-08-11 NOTE — Progress Notes (Signed)
[address]       [place] [address]             [phone]                    Karl Wise  Medical Record #[id number] Date of Birth: 12-23-1965  Referring: Karl Lewandowsky, MD Primary Oncology Care: Dr Karl Wise Chief Complaint:    Chief Complaint  Patient presents with  . Follow-up    discuss MRI of liver 07/28/13 - [facility]      History of Present Illness:    Karl Wise 48 y.o. male is seen in the office  today for evaluation of possible surgical resection. In dec 2014 patient noted increasing cough. Chest xray and CT  Showed large right upper chest mass 9cm  with question of mediastinal invasion. Bx was preformed. He has completed 4cycles of chemo. Paclitaxel had to be stopped after 2 round due to allergic reaction. Follow up ct shows decrease in size of mass but suggestion of two new new lesions in the liver. Fu MRI of liver shows no evidence of liver mets. Functionally patient remains active, working in [employer]. He is a none smoker and has no known occupational exposures.   Since last and the patient developed DVT in the left lower extremity and is now on xarelto.  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   Zubrod Score: At the time of surgery this patient's most appropriate activity status/level should be described as: $RemoveBefor'[x]'uSaNxvGYMMhH$     0    Normal activity, no symptoms $RemoveBef'[]'ETgJJhQutu$     1    Restricted in physical strenuous activity but ambulatory, able to do out light work $RemoveBe'[]'TxzYWVOfv$     2    Ambulatory and capable of self care, unable to do work activities, up and about               >50 % of waking hours                              '[]'$     3    Only limited self care, in bed greater than 50% of waking hours $RemoveBefo'[]'UqHSXdRhlQt$     4    Completely disabled, no self care, confined to bed or chair $Remove'[]'nnAfQpM$     5    Moribund   Past  Medical History  Diagnosis Date  . Lung cancer     T3,N0,M0, CT-guided needle BX's positive for poorly differentiated carcinoma  . GERD (gastroesophageal reflux disease)   . Kidney stones   . Anemia   . Hypertension     Past Surgical History  Procedure Laterality Date  . Cyst removed off of right side of neck    . Inguinal hernia repair    . Port-a-cath placement       family history : father died 52 "heart Disease" and history of DM. Mother died in 91's heart disease, only child no children    History   Social History  . Marital Status: Married    Spouse Name: N/A    Number of Children: N/A  . 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 Smoking status: Former Smoker --  1.00 packs/day for 20 years    Types: Cigarettes    Quit date: 03/02/2005  . Smokeless tobacco: Never Used  . Alcohol Use: No  . Drug Use: No  . Sexual Activity: Not on file   Other Topics Concern  . Not on file   Social History Narrative  . No narrative on file    History  Smoking status  . Former Smoker -- 1.00 packs/day for 20 years  . Types: Cigarettes  . Quit date: 03/02/2005  Smokeless tobacco  . Never Used    History  Alcohol Use No     Allergies  Allergen Reactions  . Emend [Aprepitant] Anaphylaxis  . Paclitaxel Anaphylaxis    Current Outpatient Prescriptions  Medication Sig Dispense Refill  . hydrochlorothiazide (HYDRODIURIL) 25 MG tablet Take 25 mg by mouth daily.      . nebivolol (BYSTOLIC) 5 MG tablet Take 5 mg by mouth daily.      . rivaroxaban (XARELTO) 20 MG TABS tablet Take 20 mg by mouth daily with supper.       No current facility-administered medications for this visit.     Review of Systems:     Cardiac Review of Systems: Y or N  Chest Pain Karl Wise    ]  Resting SOB [ n  ] Exertional SOB  Blue.Reese  ]  Orthopnea Karl Wise  ]   Pedal Edema [  y ]    Palpitations [n  ] Syncope  Karl Wise  ]   Presyncope [ n   ]  General Review of Systems: [Y] = yes [  ]=no Constitional: recent weight change Blue.Reese  ];  Wt loss over the last 3 months [ 20  ] anorexia Blue.Reese  ]; fatigue Blue.Reese  ]; nausea [  ]; night sweats [ n ]; fever [ n ]; or chills [ n ];          Dental: poor dentition[n  ]; Last Dentist visit:   Eye : blurred vision [  ]; diplopia [   ]; vision changes [  ];  Amaurosis fugax[  ]; Resp: cough [  ];  wheezing[  n];  hemoptysis[ n ]; shortness of breath[ n ]; paroxysmal nocturnal dyspnea[ n ]; dyspnea on exertion[  ]; or orthopnea[  ];  GI:  gallstones[  ], vomiting[  ];  dysphagia[  ]; melena[  ];  hematochezia [  ]; heartburn[  ];   Hx of  Colonoscopy[ y ]; GU: kidney stones [  ]; hematuria[  ];   dysuria [  ];  nocturia[  ];  history of     obstruction [  ]; urinary frequency [  ]             Skin: rash, swelling[  ];, hair loss[  ];  peripheral edema[  ];  or itching[  ]; Musculosketetal: myalgias[  ];  joint swelling[  ];  joint erythema[  ];  joint pain[  ];  back pain[  ];  Heme/Lymph: bruising[  ];  bleeding[  ];  anemia[  ];  Neuro: TIA[  ];  headaches[  ];  stroke[  ];  vertigo[  ];  seizures[  ];   paresthesias[  ];  difficulty walking[y  ];  Psych:depression[  ]; anxiety[  ];  Endocrine: diabetes[ n ];  thyroid dysfunction[ n ];  Immunizations: Flu up to date [ y ]; Pneumococcal up to date [ y ];  Other:  Physical Exam: BP 148/87  Pulse 63  Resp 20  Ht $R'5\' 6"'jm$  (1.676 m)  Wt 180 lb (81.647 kg)  BMI 29.07 kg/m2  SpO2 98%  PHYSICAL EXAMINATION:  General appearance: alert, cooperative, appears stated age and no distress Neurologic: intact Heart: regular rate and rhythm, S1, S2 normal, no murmur, click, rub or gallop Lungs: clear to auscultation bilaterally Abdomen: soft, non-tender; bowel sounds normal; no masses,  no organomegaly Extremities: extremities normal, atraumatic, no cyanosis or edema and Homans sign is negative, no sign of DVT no cervical or axillary or subclavicular adenopthy     Diagnostic Studies & Laboratory data:     Recent Radiology Findings: 07/25/2013: <HTML><META HTTP-EQUIV="content-type" CONTENT="text/html;charset=utf-8">EXAM:<BR>CT CHEST WITHOUT CONTRAST<BR> <BR>TECHNIQUE:<BR>Multidetector CT imaging of the chest was performed following the<BR>standard protocol without IV contrast..<BR> <BR>COMPARISON: Chest CT 06/22/2013.<BR> <BR>FINDINGS:<BR>Mediastinum: Heart size is normal. There is no significant<BR>pericardial fluid, thickening or pericardial calcification. No<BR>pathologically enlarged mediastinal or hilar lymph nodes. Please<BR>note that accurate exclusion of hilar adenopathy is limited on<BR>noncontrast CT scans. No definitive evidence of direct mediastinal<BR>invasion from the right upper lobe mass on today's non contrast CT<BR>examination. Esophagus is unremarkable in appearance. Right internal<BR>jugular single-lumen porta cath with tip terminating at the superior<BR>cavoatrial junction.<BR> <BR>Lungs/Pleura: Significant decrease in size of right upper lobe mass<BR>which currently measures 2.7 x 2.9 cm. This mass remains intimately<BR>associated with the pleural surface abutting the mediastinum,<BR>without definite evidence of direct mediastinal invasion. No other<BR>new suspicious appearing pulmonary nodules or masses are noted to<BR>suggest metastatic disease to the lungs at this time. No acute<BR>consolidative airspace disease. No pleural effusions. Mild diffuse<BR>bronchial wall thickening with mild centrilobular emphysema. Bulla<BR>in the medial aspect of the left upper lobe incidentally noted.<BR> <BR>Upper Abdomen: 2.1 cm rim calcified gallstone in the gallbladder.<BR>Previously described low-attenuation hepatic lesions in the left<BR>lobe of the liver are not confidently identified on today's non<BR>contrast CT examination.<BR> <BR>Musculoskeletal: There are no aggressive appearing lytic or blastic<BR>lesions noted in the visualized portions of the  skeleton. Small<BR>sclerotic lesion in the lateral aspect of the left third rib is<BR>unchanged compared to prior examinations, and was not hypermetabolic<BR>on prior PET-CT 03/18/2013, presumably a small bone island.<BR> <BR>IMPRESSION:<BR>1. Today's study demonstrates a positive response to therapy with<BR>further decreased size of right upper lobe lesion which is now a 2.9<BR>x 2.7 cm pleural-based nodule. No new signs of additional metastatic<BR>disease are identified within the thorax. The previously noted<BR>subcentimeter low attenuation lesions in the left lobe of the liver<BR>are not confidently identified on today's non contrast CT<BR>examination, which could suggest regression of the lesions or could<BR>be a limitation of the noncontrast study.<BR>2. Mild diffuse bronchial wall thickening with mild centrilobular<BR>emphysema.<BR>3. Cholelithiasis without evidence to suggest acute cholecystitis at<BR>this time.<BR>4. Additional incidental findings, as above.<BR> <BR> <BR>Electronically Signed<BR>By: Vinnie Langton M.D.<BR>On: 07/25/2013 15:13<BR>   4/29/2015CLINICAL DATA: Followup lung mass  EXAM: CT CHEST WITH CONTRAST  TECHNIQUE: Multidetector CT imaging of the chest was performed during intravenous contrast administration.  CONTRAST: 75 cc of Isovue 370  COMPARISON: 03/02/2013  FINDINGS: Right upper lobe lung mass measures 3.9 cm, image 14/ series 2 previously 8.6 cm. There is no pleural effusion. 3 mm right upper lobe nodule is new from previous exam, image 36/series 3. The left lung is clear. Normal heart size. No pericardial effusion. No enlarged mediastinal or hilar lymph nodes. There is no axillary or supraclavicular lymph nodes. Incidental imaging through the upper abdomen is on unremarkable. New focus of low attenuation within the lateral segment of left hepatic lobe measures 8 mm, image 55/series 2. Also new from previous exam is a 6 mm low attenuation structure along  the  dome of the liver. The adrenal glands are both normal. Review of the visualized bony structures is on unremarkable. No aggressive lytic or sclerotic bone lesions.  IMPRESSION: 1. Decrease in size of right upper lobe lung mass. 2. There are 2 new low attenuation structures within the lateral segment of left hepatic lobe. Suspicious for liver metastasis.   Electronically Signed By: Kerby Moors M.D. On: 06/22/2013 16:03   <HTML><META HTTP-EQUIV="content-type" CONTENT="text/html;charset=utf-8"><P>CLINICAL DATA: Small cell lung cancer. Evaluate for liver<BR>metastasis.<BR> <BR>EXAM:<BR>MRI ABDOMEN WITHOUT AND WITH CONTRAST<BR> <BR>TECHNIQUE:<BR>Multiplanar multisequence MR imaging of the abdomen was performed<BR>both before and after the administration of intravenous contrast.<BR> <BR>CONTRAST: 17 mL MultiHance<BR> <BR>COMPARISON: CT 03/28/2013<BR> <BR>FINDINGS:<BR>There is a small lesion in the posterior right hepatic lobe . This<BR>lesion measures 8 mm (image 14, series 3) and has high signal<BR>intensity on T2 weighted imaging. Patient demonstrate peripheral<BR>initial enhancement and delayed uniform enhancement most consistent<BR>with a small hemangioma. No additional enhancing lesion present in<BR>the liver. No duct dilatation.<BR> <BR>There is a large gallstone within the gallbladder measuring 16 mm.<BR>The pancreas, spleen, adrenal glands, kidneys are normal.<BR> <BR>The stomach, and limited view of the small bowel and colon are<BR>unremarkable. No retroperitoneal lymphadenopathy. Lung bases clear.<BR>No aggressive osseous lesion.<BR> <BR>IMPRESSION:<BR>1. Small enhancing lesion in the right hepatic lobe is most<BR>consistent with a benign hemangioma. This corresponds to the<BR>indeterminate lesion on comparison CT.<BR> <BR>2. 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 - Discussed surgical resection with patient, may not be able to determine resectable with out surgical intervention.   Considering these scan done June 1 in a dramatic response the patient has had been without evidence of distant metastasis I discussed with him attempting surgical resection of the residual tumor. Alternative treatment could consist of radiation therapy in this has been discussed with them also. I recommended to him that we proceed with attempted resection of the right upper lobe mass and what appears to be more localized mediastinal invasion but potentially could be resected compared to his initial presentation in January of 2015. The patient is considering his options and will call back and cervical days to let us know if he would like to proceed with surgical resection.   I spent 45 minutes counseling the patient face to face. The total time spent in the appointment was 60 minutes.  Grace Isaac MD      [address] [place] [address] Office [phone]   Beeper [phone]  08/11/2013 12:53 PM

## 2013-08-23 ENCOUNTER — Other Ambulatory Visit: Payer: Self-pay | Admitting: *Deleted

## 2013-08-23 DIAGNOSIS — R918 Other nonspecific abnormal finding of lung field: Secondary | ICD-10-CM

## 2013-08-24 ENCOUNTER — Telehealth: Payer: Self-pay | Admitting: *Deleted

## 2013-08-24 ENCOUNTER — Ambulatory Visit: Payer: Self-pay | Admitting: Oncology

## 2013-08-24 NOTE — Telephone Encounter (Signed)
Patient's surgery is booked for Monday July 13th at 7:30am with Dr. Servando Snare.  Patient in on Xarelto and has been told to start Lovenox bridging pre-op but the patient has refused to take the shots so his MD (Dr. Oliva Bustard) at [facility] is putting in an IVC filter either on 7/6 or 7/9.  The patient will call back to confirm the date.  He knows to stop his Xarelto on 7/9.  No further questions.  Patient will call back if anything else changes.

## 2013-08-26 ENCOUNTER — Encounter (HOSPITAL_COMMUNITY): Payer: Self-pay | Admitting: Pharmacy Technician

## 2013-09-01 ENCOUNTER — Encounter (HOSPITAL_COMMUNITY)
Admission: RE | Admit: 2013-09-01 | Discharge: 2013-09-01 | Disposition: A | Discharge status: Home / Self Care | Payer: 59 | Source: Ambulatory Visit | Attending: Cardiothoracic Surgery | Admitting: Cardiothoracic Surgery

## 2013-09-01 ENCOUNTER — Other Ambulatory Visit: Payer: Self-pay

## 2013-09-01 ENCOUNTER — Encounter (HOSPITAL_COMMUNITY): Payer: Self-pay

## 2013-09-01 VITALS — BP 150/86 | HR 94 | Temp 98.9°F | Resp 20 | Ht 66.0 in | Wt 183.7 lb

## 2013-09-01 DIAGNOSIS — Z01812 Encounter for preprocedural laboratory examination: Secondary | ICD-10-CM | POA: Insufficient documentation

## 2013-09-01 DIAGNOSIS — R918 Other nonspecific abnormal finding of lung field: Secondary | ICD-10-CM

## 2013-09-01 HISTORY — DX: Personal history of other medical treatment: Z92.89

## 2013-09-01 LAB — COMPREHENSIVE METABOLIC PANEL
ALT: 13 U/L (ref 0–53)
AST: 29 U/L (ref 0–37)
Albumin: 4 g/dL (ref 3.5–5.2)
Alkaline Phosphatase: 105 U/L (ref 39–117)
Anion gap: 19 — ABNORMAL HIGH (ref 5–15)
BUN: 21 mg/dL (ref 6–23)
CO2: 18 mEq/L — ABNORMAL LOW (ref 19–32)
Calcium: 9.1 mg/dL (ref 8.4–10.5)
Chloride: 100 mEq/L (ref 96–112)
Creatinine, Ser: 1.86 mg/dL — ABNORMAL HIGH (ref 0.50–1.35)
GFR calc Af Amer: 48 mL/min — ABNORMAL LOW (ref >90)
GFR calc non Af Amer: 41 mL/min — ABNORMAL LOW (ref >90)
Glucose, Bld: 97 mg/dL (ref 70–99)
Potassium: 4.8 mEq/L (ref 3.7–5.3)
Sodium: 137 mEq/L (ref 137–147)
Total Bilirubin: 0.6 mg/dL (ref 0.3–1.2)
Total Protein: 7.4 g/dL (ref 6.0–8.3)

## 2013-09-01 LAB — CBC
HCT: 53.8 % — ABNORMAL HIGH (ref 39.0–52.0)
Hemoglobin: 18.1 g/dL — ABNORMAL HIGH (ref 13.0–17.0)
MCH: 37.8 pg — ABNORMAL HIGH (ref 26.0–34.0)
MCHC: 33.6 g/dL (ref 30.0–36.0)
MCV: 112.3 fL — ABNORMAL HIGH (ref 78.0–100.0)
Platelets: 124 10*3/uL — ABNORMAL LOW (ref 150–400)
RBC: 4.79 MIL/uL (ref 4.22–5.81)
RDW: 18 % — ABNORMAL HIGH (ref 11.5–15.5)
WBC: 6.7 10*3/uL (ref 4.0–10.5)

## 2013-09-01 LAB — URINALYSIS, ROUTINE W REFLEX MICROSCOPIC
Bilirubin Urine: NEGATIVE
Glucose, UA: NEGATIVE mg/dL
Hgb urine dipstick: NEGATIVE
Ketones, ur: NEGATIVE mg/dL
Leukocytes, UA: NEGATIVE
Nitrite: NEGATIVE
Protein, ur: NEGATIVE mg/dL
Specific Gravity, Urine: 1.014 (ref 1.005–1.030)
Urobilinogen, UA: 0.2 mg/dL (ref 0.0–1.0)
pH: 6 (ref 5.0–8.0)

## 2013-09-01 LAB — BLOOD GAS, ARTERIAL
Acid-base deficit: 0.8 mmol/L (ref 0.0–2.0)
Bicarbonate: 23.2 mEq/L (ref 20.0–24.0)
Drawn by: 206361
FIO2: 0.21 %
O2 Saturation: 94.5 %
Patient temperature: 98.6
TCO2: 24.4 mmol/L (ref 0–100)
pCO2 arterial: 37.7 mmHg (ref 35.0–45.0)
pH, Arterial: 7.406 (ref 7.350–7.450)
pO2, Arterial: 78 mmHg — ABNORMAL LOW (ref 80.0–100.0)

## 2013-09-01 LAB — TYPE AND SCREEN
ABO/RH(D): O POS
Antibody Screen: NEGATIVE

## 2013-09-01 LAB — ABO/RH: ABO/RH(D): O POS

## 2013-09-01 LAB — APTT: aPTT: 31 seconds (ref 24–37)

## 2013-09-01 LAB — PROTIME-INR
INR: 1.1 (ref 0.00–1.49)
Prothrombin Time: 14.2 seconds (ref 11.6–15.2)

## 2013-09-01 LAB — SURGICAL PCR SCREEN
MRSA, PCR: NEGATIVE
Staphylococcus aureus: NEGATIVE

## 2013-09-01 NOTE — Pre-Procedure Instructions (Signed)
Habib Kise  09/01/2013   Your procedure is scheduled on:  09-05-2013  Monday   Report to [facility] Admitting at 5:30 AM.   Call this number if you have problems the morning of surgery: [phone]   Remember:   Do not eat food or drink liquids after midnight.    Take these medicines the morning of surgery with A SIP OF WATER: Allegra if needed,nebivolol(Bystolic),zegred   Do not wear jewelry,   Do not wear lotions, powders, or perfumes.   Do not shave 48 hours prior to surgery. Men may shave face and neck.  Do not bring valuables to the hospital.  [facility] is not responsible   for any belongings or valuables.               Contacts, dentures or bridgework may not be worn into surgery.   Leave suitcase in the car. After surgery it may be brought to your room.   For patients admitted to the hospital, discharge time is determined by your  treatment team.                 Special Instructions: See attached sheet for instructions on CHG Shower/Bath    Please read over the following fact sheets that you were given: Pain Booklet, Coughing and Deep Breathing and Surgical Site Infection Prevention

## 2013-09-02 ENCOUNTER — Ambulatory Visit: Payer: Self-pay | Admitting: Vascular Surgery

## 2013-09-04 MED ORDER — DEXTROSE 5 % IV SOLN
1.5000 g | INTRAVENOUS | Status: AC
  Administered 2013-09-05 (×2): 1.5 g via INTRAVENOUS
  Filled 2013-09-04: qty 1.5

## 2013-09-05 ENCOUNTER — Encounter (HOSPITAL_COMMUNITY)
Admission: RE | Disposition: A | Discharge status: Home / Self Care | Payer: Self-pay | Source: Ambulatory Visit | Attending: Cardiothoracic Surgery

## 2013-09-05 ENCOUNTER — Inpatient Hospital Stay (HOSPITAL_COMMUNITY)
Admission: RE | Admit: 2013-09-05 | Discharge: 2013-09-09 | DRG: 165 | Disposition: A | Discharge status: Home / Self Care | Payer: 59 | Source: Ambulatory Visit | Attending: Cardiothoracic Surgery | Admitting: Cardiothoracic Surgery

## 2013-09-05 ENCOUNTER — Encounter (HOSPITAL_COMMUNITY): Payer: Self-pay

## 2013-09-05 ENCOUNTER — Inpatient Hospital Stay (HOSPITAL_COMMUNITY): Payer: 59

## 2013-09-05 ENCOUNTER — Inpatient Hospital Stay (HOSPITAL_COMMUNITY): Payer: 59 | Admitting: Certified Registered"

## 2013-09-05 ENCOUNTER — Encounter (HOSPITAL_COMMUNITY): Payer: 59 | Admitting: Certified Registered"

## 2013-09-05 DIAGNOSIS — Z7901 Long term (current) use of anticoagulants: Secondary | ICD-10-CM

## 2013-09-05 DIAGNOSIS — I498 Other specified cardiac arrhythmias: Secondary | ICD-10-CM | POA: Diagnosis not present

## 2013-09-05 DIAGNOSIS — R918 Other nonspecific abnormal finding of lung field: Secondary | ICD-10-CM

## 2013-09-05 DIAGNOSIS — K219 Gastro-esophageal reflux disease without esophagitis: Secondary | ICD-10-CM | POA: Diagnosis present

## 2013-09-05 DIAGNOSIS — Z86718 Personal history of other venous thrombosis and embolism: Secondary | ICD-10-CM

## 2013-09-05 DIAGNOSIS — R222 Localized swelling, mass and lump, trunk: Secondary | ICD-10-CM

## 2013-09-05 DIAGNOSIS — I82402 Acute embolism and thrombosis of unspecified deep veins of left lower extremity: Secondary | ICD-10-CM | POA: Diagnosis present

## 2013-09-05 DIAGNOSIS — Z9221 Personal history of antineoplastic chemotherapy: Secondary | ICD-10-CM

## 2013-09-05 DIAGNOSIS — Z87891 Personal history of nicotine dependence: Secondary | ICD-10-CM

## 2013-09-05 DIAGNOSIS — I10 Essential (primary) hypertension: Secondary | ICD-10-CM | POA: Diagnosis present

## 2013-09-05 DIAGNOSIS — C3410 Malignant neoplasm of upper lobe, unspecified bronchus or lung: Principal | ICD-10-CM | POA: Diagnosis present

## 2013-09-05 DIAGNOSIS — Z902 Acquired absence of lung [part of]: Secondary | ICD-10-CM

## 2013-09-05 HISTORY — DX: Acute embolism and thrombosis of unspecified deep veins of left lower extremity: I82.402

## 2013-09-05 HISTORY — PX: VIDEO BRONCHOSCOPY: SHX5072

## 2013-09-05 HISTORY — PX: VIDEO ASSISTED THORACOSCOPY (VATS)/WEDGE RESECTION: SHX6174

## 2013-09-05 LAB — POCT I-STAT 7, (LYTES, BLD GAS, ICA,H+H)
Bicarbonate: 26.6 mEq/L — ABNORMAL HIGH (ref 20.0–24.0)
Calcium, Ion: 1.22 mmol/L (ref 1.12–1.23)
HCT: 54 % — ABNORMAL HIGH (ref 39.0–52.0)
Hemoglobin: 18.4 g/dL — ABNORMAL HIGH (ref 13.0–17.0)
O2 Saturation: 90 %
Patient temperature: 35.4
Potassium: 4.3 mEq/L (ref 3.7–5.3)
Sodium: 135 mEq/L — ABNORMAL LOW (ref 137–147)
TCO2: 28 mmol/L (ref 0–100)
pCO2 arterial: 44.1 mmHg (ref 35.0–45.0)
pH, Arterial: 7.38 (ref 7.350–7.450)
pO2, Arterial: 56 mmHg — ABNORMAL LOW (ref 80.0–100.0)

## 2013-09-05 SURGERY — BRONCHOSCOPY, VIDEO-ASSISTED
Anesthesia: General | Site: Chest | Laterality: Right

## 2013-09-05 MED ORDER — DIPHENHYDRAMINE HCL 12.5 MG/5ML PO ELIX
12.5000 mg | ORAL_SOLUTION | Freq: Four times a day (QID) | ORAL | Status: DC | PRN
  Filled 2013-09-05: qty 5

## 2013-09-05 MED ORDER — MIDAZOLAM HCL 5 MG/5ML IJ SOLN
INTRAMUSCULAR | Status: DC | PRN
  Administered 2013-09-05: 2 mg via INTRAVENOUS

## 2013-09-05 MED ORDER — DEXTROSE 5 % IV SOLN
1.5000 g | INTRAVENOUS | Status: DC
  Filled 2013-09-05: qty 1.5

## 2013-09-05 MED ORDER — SUFENTANIL CITRATE 50 MCG/ML IV SOLN
INTRAVENOUS | Status: AC
  Filled 2013-09-05: qty 1

## 2013-09-05 MED ORDER — EPHEDRINE SULFATE 50 MG/ML IJ SOLN
INTRAMUSCULAR | Status: DC | PRN
  Administered 2013-09-05: 10 mg via INTRAVENOUS
  Administered 2013-09-05: 5 mg via INTRAVENOUS

## 2013-09-05 MED ORDER — ALBUTEROL SULFATE (2.5 MG/3ML) 0.083% IN NEBU
2.5000 mg | INHALATION_SOLUTION | RESPIRATORY_TRACT | Status: DC
  Filled 2013-09-05: qty 3

## 2013-09-05 MED ORDER — ACETAMINOPHEN 160 MG/5ML PO SOLN
1000.0000 mg | Freq: Four times a day (QID) | ORAL | Status: DC
  Filled 2013-09-05: qty 40

## 2013-09-05 MED ORDER — BISACODYL 5 MG PO TBEC
10.0000 mg | DELAYED_RELEASE_TABLET | Freq: Every day | ORAL | Status: DC
  Administered 2013-09-06 – 2013-09-07 (×2): 10 mg via ORAL
  Filled 2013-09-05 (×3): qty 2

## 2013-09-05 MED ORDER — TRAMADOL HCL 50 MG PO TABS: 50.0000 mg | ORAL_TABLET | Freq: Four times a day (QID) | ORAL | Status: DC | PRN

## 2013-09-05 MED ORDER — LACTATED RINGERS IV SOLN
INTRAVENOUS | Status: DC | PRN
  Administered 2013-09-05: 07:00:00 via INTRAVENOUS

## 2013-09-05 MED ORDER — POTASSIUM CHLORIDE 10 MEQ/50ML IV SOLN: 10.0000 meq | Freq: Every day | INTRAVENOUS | Status: DC | PRN

## 2013-09-05 MED ORDER — SODIUM CHLORIDE 0.9 % IJ SOLN
INTRAMUSCULAR | Status: AC
  Filled 2013-09-05: qty 10

## 2013-09-05 MED ORDER — ONDANSETRON HCL 4 MG/2ML IJ SOLN
INTRAMUSCULAR | Status: AC
  Filled 2013-09-05: qty 2

## 2013-09-05 MED ORDER — LIDOCAINE HCL (CARDIAC) 20 MG/ML IV SOLN
INTRAVENOUS | Status: AC
  Filled 2013-09-05: qty 5

## 2013-09-05 MED ORDER — SODIUM CHLORIDE 0.9 % IJ SOLN: 9.0000 mL | INTRAMUSCULAR | Status: DC | PRN

## 2013-09-05 MED ORDER — LACTATED RINGERS IV SOLN
INTRAVENOUS | Status: DC | PRN
  Administered 2013-09-05 (×2): via INTRAVENOUS

## 2013-09-05 MED ORDER — DIPHENHYDRAMINE HCL 50 MG/ML IJ SOLN
12.5000 mg | Freq: Four times a day (QID) | INTRAMUSCULAR | Status: DC | PRN
Start: 2013-09-05 — End: 2013-09-09
  Administered 2013-09-07: 12.5 mg via INTRAVENOUS
  Filled 2013-09-05: qty 1

## 2013-09-05 MED ORDER — DEXTROSE-NACL 5-0.9 % IV SOLN
INTRAVENOUS | Status: DC
  Administered 2013-09-05 – 2013-09-06 (×2): via INTRAVENOUS
  Administered 2013-09-07: 10 mL/h via INTRAVENOUS

## 2013-09-05 MED ORDER — SENNOSIDES-DOCUSATE SODIUM 8.6-50 MG PO TABS
1.0000 | ORAL_TABLET | Freq: Every day | ORAL | Status: DC
  Administered 2013-09-05 – 2013-09-07 (×3): 1 via ORAL
  Filled 2013-09-05 (×5): qty 1

## 2013-09-05 MED ORDER — NEOSTIGMINE METHYLSULFATE 10 MG/10ML IV SOLN
INTRAVENOUS | Status: AC
  Filled 2013-09-05: qty 1

## 2013-09-05 MED ORDER — BUPIVACAINE 0.5 % ON-Q PUMP SINGLE CATH 400 ML
INJECTION | Status: DC | PRN
  Administered 2013-09-05: 400 mL

## 2013-09-05 MED ORDER — SUCCINYLCHOLINE CHLORIDE 20 MG/ML IJ SOLN
INTRAMUSCULAR | Status: AC
  Filled 2013-09-05: qty 1

## 2013-09-05 MED ORDER — BUPIVACAINE 0.5 % ON-Q PUMP SINGLE CATH 400 ML
400.0000 mL | INJECTION | Status: DC
  Filled 2013-09-05: qty 400

## 2013-09-05 MED ORDER — PROMETHAZINE HCL 25 MG/ML IJ SOLN
INTRAMUSCULAR | Status: AC
  Administered 2013-09-05: 6.25 mg via INTRAVENOUS
  Filled 2013-09-05: qty 1

## 2013-09-05 MED ORDER — OXYCODONE HCL 5 MG/5ML PO SOLN: 5.0000 mg | Freq: Once | ORAL | Status: DC | PRN

## 2013-09-05 MED ORDER — PROPOFOL 10 MG/ML IV BOLUS
INTRAVENOUS | Status: AC
  Filled 2013-09-05: qty 20

## 2013-09-05 MED ORDER — PHENYLEPHRINE HCL 10 MG/ML IJ SOLN
INTRAMUSCULAR | Status: DC | PRN
  Administered 2013-09-05: 120 ug via INTRAVENOUS

## 2013-09-05 MED ORDER — ALBUTEROL SULFATE (2.5 MG/3ML) 0.083% IN NEBU: 2.5000 mg | INHALATION_SOLUTION | RESPIRATORY_TRACT | Status: DC | PRN

## 2013-09-05 MED ORDER — ROCURONIUM BROMIDE 50 MG/5ML IV SOLN
INTRAVENOUS | Status: AC
  Filled 2013-09-05: qty 1

## 2013-09-05 MED ORDER — OMEPRAZOLE-SODIUM BICARBONATE 20-1100 MG PO CAPS
1.0000 | ORAL_CAPSULE | Freq: Every day | ORAL | Status: DC
Start: 2013-09-06 — End: 2013-09-05
  Filled 2013-09-05: qty 1

## 2013-09-05 MED ORDER — ONDANSETRON HCL 4 MG/2ML IJ SOLN
INTRAMUSCULAR | Status: DC | PRN
  Administered 2013-09-05: 4 mg via INTRAVENOUS

## 2013-09-05 MED ORDER — 0.9 % SODIUM CHLORIDE (POUR BTL) OPTIME
TOPICAL | Status: DC | PRN
  Administered 2013-09-05: 2000 mL

## 2013-09-05 MED ORDER — MIDAZOLAM HCL 2 MG/2ML IJ SOLN
INTRAMUSCULAR | Status: AC
  Filled 2013-09-05: qty 2

## 2013-09-05 MED ORDER — PROMETHAZINE HCL 25 MG/ML IJ SOLN
6.2500 mg | INTRAMUSCULAR | Status: DC | PRN
  Administered 2013-09-05: 6.25 mg via INTRAVENOUS

## 2013-09-05 MED ORDER — ONDANSETRON HCL 4 MG/2ML IJ SOLN: 4.0000 mg | Freq: Four times a day (QID) | INTRAMUSCULAR | Status: DC | PRN

## 2013-09-05 MED ORDER — HEMOSTATIC AGENTS (NO CHARGE) OPTIME
TOPICAL | Status: DC | PRN
  Administered 2013-09-05: 1 via TOPICAL

## 2013-09-05 MED ORDER — NEBIVOLOL HCL 5 MG PO TABS
5.0000 mg | ORAL_TABLET | Freq: Every day | ORAL | Status: DC
  Administered 2013-09-06 – 2013-09-09 (×4): 5 mg via ORAL
  Filled 2013-09-05 (×4): qty 1

## 2013-09-05 MED ORDER — BUPIVACAINE HCL (PF) 0.5 % IJ SOLN
INTRAMUSCULAR | Status: DC | PRN
  Administered 2013-09-05: 10 mL

## 2013-09-05 MED ORDER — OXYCODONE HCL 5 MG PO TABS
5.0000 mg | ORAL_TABLET | ORAL | Status: DC | PRN
  Administered 2013-09-08 (×2): 10 mg via ORAL
  Filled 2013-09-05 (×2): qty 2

## 2013-09-05 MED ORDER — PANTOPRAZOLE SODIUM 40 MG PO TBEC
40.0000 mg | DELAYED_RELEASE_TABLET | Freq: Every day | ORAL | Status: DC
  Administered 2013-09-06 – 2013-09-09 (×4): 40 mg via ORAL
  Filled 2013-09-05 (×4): qty 1

## 2013-09-05 MED ORDER — GLYCOPYRROLATE 0.2 MG/ML IJ SOLN
INTRAMUSCULAR | Status: DC | PRN
  Administered 2013-09-05: .4 mg via INTRAVENOUS

## 2013-09-05 MED ORDER — DEXTROSE 5 % IV SOLN
1.5000 g | Freq: Two times a day (BID) | INTRAVENOUS | Status: AC
  Administered 2013-09-05 – 2013-09-06 (×2): 1.5 g via INTRAVENOUS
  Filled 2013-09-05 (×3): qty 1.5

## 2013-09-05 MED ORDER — FENTANYL 10 MCG/ML IV SOLN
INTRAVENOUS | Status: DC
  Administered 2013-09-05: 120 ug via INTRAVENOUS
  Administered 2013-09-05: 14:00:00 via INTRAVENOUS
  Administered 2013-09-05: 45 ug via INTRAVENOUS
  Administered 2013-09-06 (×2): 90 ug via INTRAVENOUS
  Administered 2013-09-06 (×2): via INTRAVENOUS
  Administered 2013-09-06: 150 ug via INTRAVENOUS
  Administered 2013-09-06: 60 ug via INTRAVENOUS
  Administered 2013-09-06 (×2): 90 ug via INTRAVENOUS
  Administered 2013-09-06: 115.8 ug via INTRAVENOUS
  Administered 2013-09-07: 15 ug via INTRAVENOUS
  Administered 2013-09-07: 90 ug via INTRAVENOUS
  Administered 2013-09-07: 65 ug via INTRAVENOUS
  Administered 2013-09-07: 105 ug via INTRAVENOUS
  Administered 2013-09-08: 30 ug via INTRAVENOUS
  Filled 2013-09-05 (×4): qty 50

## 2013-09-05 MED ORDER — OXYCODONE HCL 5 MG PO TABS: 5.0000 mg | ORAL_TABLET | Freq: Once | ORAL | Status: DC | PRN

## 2013-09-05 MED ORDER — ROCURONIUM BROMIDE 100 MG/10ML IV SOLN
INTRAVENOUS | Status: DC | PRN
  Administered 2013-09-05 (×3): 10 mg via INTRAVENOUS
  Administered 2013-09-05: 50 mg via INTRAVENOUS
  Administered 2013-09-05 (×3): 10 mg via INTRAVENOUS
  Administered 2013-09-05: 20 mg via INTRAVENOUS

## 2013-09-05 MED ORDER — HYDROMORPHONE HCL PF 1 MG/ML IJ SOLN: 0.2500 mg | INTRAMUSCULAR | Status: DC | PRN

## 2013-09-05 MED ORDER — SUFENTANIL CITRATE 50 MCG/ML IV SOLN
INTRAVENOUS | Status: DC | PRN
  Administered 2013-09-05: 5 ug via INTRAVENOUS
  Administered 2013-09-05 (×2): 10 ug via INTRAVENOUS
  Administered 2013-09-05 (×3): 5 ug via INTRAVENOUS
  Administered 2013-09-05 (×2): 10 ug via INTRAVENOUS
  Administered 2013-09-05 (×3): 5 ug via INTRAVENOUS

## 2013-09-05 MED ORDER — EPHEDRINE SULFATE 50 MG/ML IJ SOLN
INTRAMUSCULAR | Status: AC
  Filled 2013-09-05: qty 1

## 2013-09-05 MED ORDER — BIOTENE DRY MOUTH MT LIQD
15.0000 mL | Freq: Two times a day (BID) | OROMUCOSAL | Status: DC
  Administered 2013-09-05 – 2013-09-09 (×6): 15 mL via OROMUCOSAL

## 2013-09-05 MED ORDER — ACETAMINOPHEN 500 MG PO TABS
1000.0000 mg | ORAL_TABLET | Freq: Four times a day (QID) | ORAL | Status: DC
  Administered 2013-09-06 – 2013-09-09 (×13): 1000 mg via ORAL
  Filled 2013-09-05 (×21): qty 2

## 2013-09-05 MED ORDER — BUPIVACAINE HCL (PF) 0.5 % IJ SOLN
INTRAMUSCULAR | Status: AC
  Filled 2013-09-05: qty 10

## 2013-09-05 MED ORDER — NEOSTIGMINE METHYLSULFATE 10 MG/10ML IV SOLN
INTRAVENOUS | Status: DC | PRN
  Administered 2013-09-05: 3 mg via INTRAVENOUS

## 2013-09-05 MED ORDER — ALBUTEROL SULFATE (2.5 MG/3ML) 0.083% IN NEBU: 2.5000 mg | INHALATION_SOLUTION | RESPIRATORY_TRACT | Status: DC

## 2013-09-05 MED ORDER — NALOXONE HCL 0.4 MG/ML IJ SOLN: 0.4000 mg | INTRAMUSCULAR | Status: DC | PRN

## 2013-09-05 MED ORDER — PROPOFOL 10 MG/ML IV BOLUS
INTRAVENOUS | Status: DC | PRN
  Administered 2013-09-05: 150 mg via INTRAVENOUS

## 2013-09-05 MED ORDER — GLYCOPYRROLATE 0.2 MG/ML IJ SOLN
INTRAMUSCULAR | Status: AC
  Filled 2013-09-05: qty 2

## 2013-09-05 SURGICAL SUPPLY — 97 items
APPLICATOR TIP COSEAL (VASCULAR PRODUCTS) IMPLANT
APPLICATOR TIP EXT COSEAL (VASCULAR PRODUCTS) IMPLANT
BLADE SURG 11 STRL SS (BLADE) ×3 IMPLANT
BRUSH CYTOL CELLEBRITY 1.5X140 (MISCELLANEOUS) IMPLANT
CANISTER SUCTION 2500CC (MISCELLANEOUS) ×6 IMPLANT
CATH KIT ON Q 5IN SLV (PAIN MANAGEMENT) ×3 IMPLANT
CATH THORACIC 28FR (CATHETERS) ×3 IMPLANT
CATH THORACIC 36FR (CATHETERS) IMPLANT
CATH THORACIC 36FR RT ANG (CATHETERS) IMPLANT
CLIP TI LARGE 6 (CLIP) ×6 IMPLANT
CLIP TI MEDIUM 6 (CLIP) ×3 IMPLANT
CONN ST 1/4X3/8  BEN (MISCELLANEOUS)
CONN ST 1/4X3/8 BEN (MISCELLANEOUS) IMPLANT
CONN Y 3/8X3/8X3/8  BEN (MISCELLANEOUS) ×1
CONN Y 3/8X3/8X3/8 BEN (MISCELLANEOUS) ×2 IMPLANT
CONT SPEC 4OZ CLIKSEAL STRL BL (MISCELLANEOUS) ×15 IMPLANT
COVER TABLE BACK 60X90 (DRAPES) ×3 IMPLANT
DERMABOND ADVANCED (GAUZE/BANDAGES/DRESSINGS) ×1
DERMABOND ADVANCED .7 DNX12 (GAUZE/BANDAGES/DRESSINGS) ×2 IMPLANT
DRAIN CHANNEL 28F RND 3/8 FF (WOUND CARE) ×3 IMPLANT
DRAIN CHANNEL 32F RND 10.7 FF (WOUND CARE) IMPLANT
DRAPE LAPAROSCOPIC ABDOMINAL (DRAPES) ×3 IMPLANT
DRAPE WARM FLUID 44X44 (DRAPE) ×3 IMPLANT
DRILL BIT 7/64X5 (BIT) ×3 IMPLANT
ELECT BLADE 4.0 EZ CLEAN MEGAD (MISCELLANEOUS) ×3
ELECT REM PT RETURN 9FT ADLT (ELECTROSURGICAL) ×3
ELECTRODE BLDE 4.0 EZ CLN MEGD (MISCELLANEOUS) ×2 IMPLANT
ELECTRODE REM PT RTRN 9FT ADLT (ELECTROSURGICAL) ×2 IMPLANT
FORCEPS BIOP RJ4 1.8 (CUTTING FORCEPS) IMPLANT
GLOVE BIO SURGEON STRL SZ 6.5 (GLOVE) ×15 IMPLANT
GLOVE BIO SURGEON STRL SZ7.5 (GLOVE) ×6 IMPLANT
GLOVE BIOGEL PI IND STRL 6.5 (GLOVE) ×6 IMPLANT
GLOVE BIOGEL PI IND STRL 7.0 (GLOVE) ×2 IMPLANT
GLOVE BIOGEL PI IND STRL 7.5 (GLOVE) ×2 IMPLANT
GLOVE BIOGEL PI INDICATOR 6.5 (GLOVE) ×3
GLOVE BIOGEL PI INDICATOR 7.0 (GLOVE) ×1
GLOVE BIOGEL PI INDICATOR 7.5 (GLOVE) ×1
GOWN STRL REUS W/ TWL LRG LVL3 (GOWN DISPOSABLE) ×16 IMPLANT
GOWN STRL REUS W/TWL LRG LVL3 (GOWN DISPOSABLE) ×8
HANDLE STAPLE ENDO GIA SHORT (STAPLE) ×1
KIT BASIN OR (CUSTOM PROCEDURE TRAY) ×3 IMPLANT
KIT ROOM TURNOVER OR (KITS) ×3 IMPLANT
KIT SUCTION CATH 14FR (SUCTIONS) ×3 IMPLANT
MARKER SKIN DUAL TIP RULER LAB (MISCELLANEOUS) ×3 IMPLANT
NEEDLE BIOPSY TRANSBRONCH 21G (NEEDLE) IMPLANT
NS IRRIG 1000ML POUR BTL (IV SOLUTION) ×12 IMPLANT
OIL SILICONE PENTAX (PARTS (SERVICE/REPAIRS)) ×3 IMPLANT
PACK CHEST (CUSTOM PROCEDURE TRAY) ×3 IMPLANT
PAD ARMBOARD 7.5X6 YLW CONV (MISCELLANEOUS) ×9 IMPLANT
PASSER SUT SWANSON 36MM LOOP (INSTRUMENTS) IMPLANT
RELOAD EGIA 45 MED/THCK PURPLE (STAPLE) ×6 IMPLANT
RELOAD EGIA 45 TAN VASC (STAPLE) ×6 IMPLANT
RELOAD EGIA 60 MED/THCK PURPLE (STAPLE) ×3 IMPLANT
RELOAD EGIA BLACK ROTIC 45MM (STAPLE) ×3 IMPLANT
RELOAD EGIA TRIS TAN 45 CVD (STAPLE) ×6 IMPLANT
SCISSORS LAP 5X35 DISP (ENDOMECHANICALS) IMPLANT
SEALANT PROGEL (MISCELLANEOUS) IMPLANT
SEALANT SURG COSEAL 4ML (VASCULAR PRODUCTS) IMPLANT
SEALANT SURG COSEAL 8ML (VASCULAR PRODUCTS) ×3 IMPLANT
SOLUTION ANTI FOG 6CC (MISCELLANEOUS) ×3 IMPLANT
SPONGE GAUZE 4X4 12PLY (GAUZE/BANDAGES/DRESSINGS) ×3 IMPLANT
SPONGE GAUZE 4X4 12PLY STER LF (GAUZE/BANDAGES/DRESSINGS) ×3 IMPLANT
SPONGE INTESTINAL PEANUT (DISPOSABLE) ×9 IMPLANT
STAPLER ENDO GIA 12MM SHORT (STAPLE) ×2 IMPLANT
SUT PROLENE 3 0 SH DA (SUTURE) IMPLANT
SUT PROLENE 4 0 RB 1 (SUTURE)
SUT PROLENE 4-0 RB1 .5 CRCL 36 (SUTURE) IMPLANT
SUT SILK  1 MH (SUTURE) ×6
SUT SILK 1 MH (SUTURE) ×12 IMPLANT
SUT SILK 2 0 SH (SUTURE) IMPLANT
SUT SILK 2 0SH CR/8 30 (SUTURE) IMPLANT
SUT SILK 3 0SH CR/8 30 (SUTURE) IMPLANT
SUT STEEL 1 (SUTURE) IMPLANT
SUT VIC AB 1 CTX 18 (SUTURE) ×3 IMPLANT
SUT VIC AB 1 CTX 36 (SUTURE) ×1
SUT VIC AB 1 CTX36XBRD ANBCTR (SUTURE) ×2 IMPLANT
SUT VIC AB 2-0 CTX 36 (SUTURE) ×3 IMPLANT
SUT VIC AB 2-0 UR6 27 (SUTURE) IMPLANT
SUT VIC AB 3-0 SH 8-18 (SUTURE) IMPLANT
SUT VIC AB 3-0 X1 27 (SUTURE) ×6 IMPLANT
SUT VICRYL 2 TP 1 (SUTURE) ×3 IMPLANT
SWAB COLLECTION DEVICE MRSA (MISCELLANEOUS) IMPLANT
SYR 20ML ECCENTRIC (SYRINGE) ×3 IMPLANT
SYSTEM SAHARA CHEST DRAIN ATS (WOUND CARE) ×3 IMPLANT
TAPE CLOTH SURG 6X10 WHT LF (GAUZE/BANDAGES/DRESSINGS) ×3 IMPLANT
TAPE UMBILICAL COTTON 1/8X30 (MISCELLANEOUS) ×3 IMPLANT
TIP APPLICATOR SPRAY EXTEND 16 (VASCULAR PRODUCTS) ×3 IMPLANT
TOWEL OR 17X24 6PK STRL BLUE (TOWEL DISPOSABLE) ×6 IMPLANT
TOWEL OR 17X26 10 PK STRL BLUE (TOWEL DISPOSABLE) ×6 IMPLANT
TRAP SPECIMEN MUCOUS 40CC (MISCELLANEOUS) ×3 IMPLANT
TRAY FOLEY CATH 16FRSI W/METER (SET/KITS/TRAYS/PACK) ×3 IMPLANT
TROCAR XCEL BLUNT TIP 100MML (ENDOMECHANICALS) ×3 IMPLANT
TROCAR XCEL NON-BLD 11X100MML (ENDOMECHANICALS) IMPLANT
TUBE ANAEROBIC SPECIMEN COL (MISCELLANEOUS) IMPLANT
TUBE CONNECTING 12X1/4 (SUCTIONS) ×6 IMPLANT
TUNNELER SHEATH ON-Q 11GX8 DSP (PAIN MANAGEMENT) ×3 IMPLANT
WATER STERILE IRR 1000ML POUR (IV SOLUTION) ×6 IMPLANT

## 2013-09-05 NOTE — Progress Notes (Signed)
Patient ID: Darik Massing, male   DOB: 1965-04-18, 48 y.o.   MRN: [id number] EVENING ROUNDS NOTE :     [address]       [place] [address]             [phone]                 Day of Surgery Procedure(s) (LRB): VIDEO BRONCHOSCOPY (N/A) VIDEO ASSISTED THORACOSCOPY (VATS); Right Thoracotomy; Right Upper Lobectomy with Node Dissection and Enblock Resection of Mediastinal Invasion and Placement of OnQ Pain Pump (Right)  Total Length of Stay:  LOS: 0 days  BP 140/83  Pulse 58  Temp(Src) 97.4 F (36.3 C) (Oral)  Resp 15  Ht 5\' 6"  (1.676 m)  Wt 183 lb (83.008 kg)  BMI 29.55 kg/m2  SpO2 97%  .Intake/Output     07/13 0701 - 07/14 0700   P.O. 30   I.V. (mL/kg) 2387 (28.8)   Total Intake(mL/kg) 2417 (29.1)   Urine (mL/kg/hr) 885 (0.8)   Emesis/NG output 300 (0.3)   Blood 100 (0.1)   Chest Tube 200 (0.2)   Total Output 1485   Net +932         . dextrose 5 % and 0.9% NaCl 125 mL/hr at 09/05/13 1700     Lab Results  Component Value Date   WBC 6.7 09/01/2013   HGB 18.4* 09/05/2013   HCT 54.0* 09/05/2013   PLT 124* 09/01/2013   GLUCOSE 97 09/01/2013   ALT 13 09/01/2013   AST 29 09/01/2013   NA 135* 09/05/2013   K 4.3 09/05/2013   CL 100 09/01/2013   CREATININE 1.86* 09/01/2013   BUN 21 09/01/2013   CO2 18* 09/01/2013   INR 1.10 09/01/2013   Stable post op Chronic Kidney Disease   Stage I     GFR >90  Stage II    GFR 60-89  Stage IIIA GFR 45-59  Stage IIIB GFR 30-44  Stage IV   GFR 15-29  Stage V    GFR  <15  Lab Results  Component Value Date   CREATININE 1.86* 09/01/2013   Estimated Creatinine Clearance: 49.7 ml/min (by C-G formula based on Cr of 1.86). Chronic Kidney Disease stage IIIA   Grace Isaac MD  Beeper [phone] Office [phone] 09/05/2013 7:50 PM

## 2013-09-05 NOTE — Progress Notes (Signed)
Utilization Review Completed.Donne Anon T7/13/2015

## 2013-09-05 NOTE — Brief Op Note (Addendum)
      [address]       [place] [address]             [phone]     09/05/2013  11:57 AM  PATIENT:  Karl Wise  48 y.o. male  PRE-OPERATIVE DIAGNOSIS:  RUL MASS  POST-OPERATIVE DIAGNOSIS:  RUL MASS  PROCEDURE:  Procedure(s): VIDEO BRONCHOSCOPY VIDEO ASSISTED THORACOSCOPY (VATS); Right Thoracotomy; Right Upper Lobectomy with Lymph Node Dissection and En Bloc resection of mediastinal invasion of residual tumor Placement of OnQ Pain Pump  SURGEON:  Surgeon(s): Grace Isaac, MD  PHYSICIAN ASSISTANT: WAYNE GOLD PA-C  ANESTHESIA:   general  SPECIMEN:  Source of Specimen:  RUL , MULTIPLE LN'S, RESIDUAL TUMOR FROM MEDIASTINUM  DISPOSITION OF SPECIMEN:  Pathology  DRAINS: 2 Chest Tube(s) in the RIGHT HEMI-THORAX   PATIENT CONDITION:  PACU - hemodynamically stable.  PRE-OPERATIVE WEIGHT: 44RX  COMPLICATIONS: NO KNOWN

## 2013-09-05 NOTE — Anesthesia Procedure Notes (Signed)
Procedure Name: Intubation Date/Time: 09/05/2013 7:33 AM Performed by: Melina Copa, Drayke R Pre-anesthesia Checklist: Patient identified, Emergency Drugs available, Suction available, Patient being monitored and Timeout performed Patient Re-evaluated:Patient Re-evaluated prior to inductionOxygen Delivery Method: Circle system utilized Preoxygenation: Pre-oxygenation with 100% oxygen Intubation Type: IV induction Ventilation: Mask ventilation without difficulty Laryngoscope Size: Mac and 3 Grade View: Grade I Tube type: Oral Tube size: 8.5 mm Number of attempts: 1 Airway Equipment and Method: Stylet Placement Confirmation: ETT inserted through vocal cords under direct vision,  positive ETCO2 and breath sounds checked- equal and bilateral Secured at: 22 cm Tube secured with: Tape Dental Injury: Teeth and Oropharynx as per pre-operative assessment     Procedure Name: Intubation Date/Time: 09/05/2013 7:49 AM Performed by: Melina Copa, Jian R Pre-anesthesia Checklist: Patient identified, Emergency Drugs available, Suction available, Patient being monitored and Timeout performed Patient Re-evaluated:Patient Re-evaluated prior to inductionOxygen Delivery Method: Circle system utilized Preoxygenation: Pre-oxygenation with 100% oxygen Laryngoscope Size: Mac and 3 (existing 8.5 oral ETT removed) Grade View: Grade I Endobronchial tube: Double lumen EBT, Left, EBT position confirmed by fiberoptic bronchoscope and EBT position confirmed by auscultation and 39 Fr Airway Equipment and Method: Stylet and Fiberoptic brochoscope Placement Confirmation: ETT inserted through vocal cords under direct vision,  positive ETCO2 and breath sounds checked- equal and bilateral Secured at: 32 cm Tube secured with: Tape Dental Injury: Teeth and Oropharynx as per pre-operative assessment

## 2013-09-05 NOTE — Anesthesia Preprocedure Evaluation (Addendum)
Anesthesia Evaluation  Patient identified by MRN, date of birth, ID band Patient awake    Reviewed: Allergy & Precautions, H&P , NPO status , Patient's Chart, lab work & pertinent test results  History of Anesthesia Complications Negative for: history of anesthetic complications  Airway Mallampati: III TM Distance: >3 FB Neck ROM: Full    Dental  (+) Teeth Intact, Chipped,    Pulmonary neg shortness of breath, neg sleep apnea, neg COPDneg recent URI, former smoker,  Right lung cancer s/p chemo breath sounds clear to auscultation        Cardiovascular hypertension, Pt. on medications and Pt. on home beta blockers - angina+ Peripheral Vascular Disease - CAD and - Past MI - dysrhythmias Rhythm:Regular     Neuro/Psych negative neurological ROS  negative psych ROS   GI/Hepatic Neg liver ROS, GERD-  Medicated and Controlled,  Endo/Other  negative endocrine ROS  Renal/GU Renal InsufficiencyRenal disease     Musculoskeletal   Abdominal   Peds  Hematology negative hematology ROS (+)   Anesthesia Other Findings   Reproductive/Obstetrics                          Anesthesia Physical Anesthesia Plan  ASA: III  Anesthesia Plan: General   Post-op Pain Management:    Induction: Intravenous  Airway Management Planned: Double Lumen EBT  Additional Equipment: Arterial line, CVP and Ultrasound Guidance Line Placement  Intra-op Plan:   Post-operative Plan: Extubation in OR  Informed Consent: I have reviewed the patients History and Physical, chart, labs and discussed the procedure including the risks, benefits and alternatives for the proposed anesthesia with the patient or authorized representative who has indicated his/her understanding and acceptance.   Dental advisory given  Plan Discussed with: CRNA and Surgeon  Anesthesia Plan Comments:        Anesthesia Quick Evaluation

## 2013-09-05 NOTE — Transfer of Care (Signed)
Immediate Anesthesia Transfer of Care Note  Patient: Karl Wise  Procedure(s) Performed: Procedure(s): VIDEO BRONCHOSCOPY (N/A) VIDEO ASSISTED THORACOSCOPY (VATS); Right Thoracotomy; Right Upper Lobectomy with Node Dissection and Enblock Resection of Mediastinal Invasion and Placement of OnQ Pain Pump (Right)  Patient Location: PACU  Anesthesia Type:General  Level of Consciousness: awake  Airway & Oxygen Therapy: Patient Spontanous Breathing and Patient connected to nasal cannula oxygen  Post-op Assessment: Report given to PACU RN, Post -op Vital signs reviewed and stable and Patient moving all extremities  Post vital signs: Reviewed and stable  Complications: No apparent anesthesia complications

## 2013-09-05 NOTE — H&P (Signed)
[address]       [place] [address]             [phone]                    Holger Schwartzkopf [place] Medical Record #[id number] Date of Birth: 28-Jul-1965  Referring: Dr Genevive Bi, [facility] Primary Oncology Care: Dr [name] Chief Complaint:  Lung Cancer   History of Present Illness:    Serigne Kubicek 48 y.o. male is seen in the office  today for evaluation of possible surgical resection. In dec 2014 patient noted increasing cough. Chest xray and CT  Showed large right upper chest mass 9cm  with question of mediastinal invasion. Bx was preformed. He has completed 4cycles of chemo. Paclitaxel had to be stopped after 2 round due to allergic reaction. Follow up ct shows decrease in size of mass but suggestion of two new new lesions in the liver. Fu MRI of liver shows no evidence of liver mets. Functionally patient remains active, working in [employer]. He is a none smoker and has no known occupational exposures.   Since last and the patient developed DVT in the left lower extremity and was on xarelto. 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   Zubrod Score: At the time of surgery this patient's most appropriate activity status/level should be described as: _0     0    Normal activity, no symptoms _1     1    Restricted in physical strenuous activity but ambulatory, able to do out light work _2     2    Ambulatory and capable of self care, unable to do work activities, up and about               >50 % of waking hours                              _3     3    Only limited self care, in bed greater than 50% of waking hours _4     4    Completely disabled, no self care, confined to bed or chair _5     5    Moribund   Past Medical History  Diagnosis  Date  . Lung cancer     T3,N0,M0, CT-guided needle BX's positive for poorly differentiated carcinoma  . GERD (gastroesophageal reflux disease)   . Kidney stones   . Anemia   . Hypertension   . History of blood transfusion     Past Surgical History  Procedure Laterality Date  . Cyst removed off of right side of neck    . Inguinal hernia repair    . Port-a-cath placement       family history : father died 3 "heart Disease" and history of DM. Mother died in 67's heart disease, only child no children    History   Social History  . Marital Status: Married    Spouse Name: N/A    Number of Children: N/A  . 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 Smoking status: Former Smoker -- 1.00 packs/day for 20  years    Types: Cigarettes    Quit date: 03/02/2005  . Smokeless tobacco: Never Used  . Alcohol Use: Yes     Comment: ocassionally  . Drug Use: No  . Sexual Activity: Not on file   Other Topics Concern  . Not on file   Social History Narrative  . No narrative on file    History  Smoking status  . Former Smoker -- 1.00 packs/day for 20 years  . Types: Cigarettes  . Quit date: 03/02/2005  Smokeless tobacco  . Never Used    History  Alcohol Use  . Yes    Comment: ocassionally     Allergies  Allergen Reactions  . Emend [Aprepitant] Anaphylaxis  . Paclitaxel Anaphylaxis    Current Facility-Administered Medications  Medication Dose Route Frequency Provider Last Rate Last Dose  . cefUROXime (ZINACEF) 1.5 g in dextrose 5 % 50 mL IVPB  1.5 g Intravenous 60 min Pre-Op Grace Isaac, MD       Facility-Administered Medications Ordered in Other Encounters  Medication Dose Route Frequency Provider Last Rate Last Dose  . lactated ringers infusion    Continuous PRN Moshe Salisbury, CRNA      . lactated ringers infusion    Continuous PRN Moshe Salisbury, CRNA      . midazolam (VERSED) 5 MG/5ML  injection    Anesthesia Intra-op Moshe Salisbury, CRNA   2 mg at 09/05/13 0700  . SUFentanil (SUFENTA) injection    Anesthesia Intra-op Moshe Salisbury, CRNA   5 mcg at 09/05/13 1610     Review of Systems:     Cardiac Review of Systems: Y or N  Chest Pain [n    ]  Resting SOB [ n  ] Exertional SOB  Blue.Reese  ]  Vertell Limber Florencio.Farrier  ]   Pedal Edema [  y ]    Palpitations [n  ] Syncope  Florencio.Farrier  ]   Presyncope [ n  ]  General Review of Systems: [Y] = yes [  ]=no Constitional: recent weight change Blue.Reese  ];  Wt loss over the last 3 months [ 20  ] anorexia Blue.Reese  ]; fatigue Blue.Reese  ]; nausea [  ]; night sweats [ n ]; fever [ n ]; or chills [ n ];          Dental: poor dentition[n  ]; Last Dentist visit:   Eye : blurred vision [  ]; diplopia [   ]; vision changes [  ];  Amaurosis fugax[  ]; Resp: cough [  ];  wheezing[  n];  hemoptysis[ n ]; shortness of breath[ n ]; paroxysmal nocturnal dyspnea[ n ]; dyspnea on exertion[  ]; or orthopnea[  ];  GI:  gallstones[  ], vomiting[  ];  dysphagia[  ]; melena[  ];  hematochezia [  ]; heartburn[  ];   Hx of  Colonoscopy[ y ]; GU: kidney stones [  ]; hematuria[  ];   dysuria [  ];  nocturia[  ];  history of     obstruction [  ]; urinary frequency [  ]             Skin: rash, swelling[  ];, hair loss[  ];  peripheral edema[  ];  or itching[  ]; Musculosketetal: myalgias[  ];  joint swelling[  ];  joint erythema[  ];  joint pain[  ];  back pain[  ];  Heme/Lymph: bruising[  ];  bleeding[  ];  anemia[  ];  Neuro: TIA[  ];  headaches[  ];  stroke[  ];  vertigo[  ];  seizures[  ];   paresthesias[  ];  difficulty walking[y  ];  Psych:depression[  ]; anxiety[  ];  Endocrine: diabetes[ n ];  thyroid dysfunction[ n ];  Immunizations: Flu up to date [ y ]; Pneumococcal up to date [ y ];  Other:  Physical Exam: BP 176/99  Pulse 63  Temp(Src) 98.4 F (36.9 C)  Resp 20  SpO2 99%  PHYSICAL EXAMINATION:  General appearance: alert, cooperative, appears stated age and no distress Neurologic:  intact Heart: regular rate and rhythm, S1, S2 normal, no murmur, click, rub or gallop Lungs: clear to auscultation bilaterally Abdomen: soft, non-tender; bowel sounds normal; no masses,  no organomegaly Extremities: extremities normal, atraumatic, no cyanosis or edema and Homans sign is negative, no sign of DVT no cervical or axillary or subclavicular adenopthy    Diagnostic Studies & Laboratory data:     Recent Radiology Findings: Chest 2 View  09/05/2013   CLINICAL DATA:  Preoperative evaluation, right lung surgery.  EXAM: CHEST  2 VIEW  COMPARISON:  None.  FINDINGS: The heart size and mediastinal contours are within normal limits. Both lungs are clear. Single lumen right chest Port-A-Cath with distal tip projecting in mid superior vena cava. The visualized skeletal structures are unremarkable.  IMPRESSION: No active cardiopulmonary disease.   Electronically Signed   By: Elon Alas   On: 09/05/2013 06:16   07/25/2013: <HTML><META HTTP-EQUIV="content-type" CONTENT="text/html;charset=utf-8">EXAM:<BR>CT CHEST WITHOUT CONTRAST<BR> <BR>TECHNIQUE:<BR>Multidetector CT imaging of the chest was performed following the<BR>standard protocol without IV contrast..<BR> <BR>COMPARISON: Chest CT 06/22/2013.<BR> <BR>FINDINGS:<BR>Mediastinum: Heart size is normal. There is no significant<BR>pericardial fluid, thickening or pericardial calcification. No<BR>pathologically enlarged mediastinal or hilar lymph nodes. Please<BR>note that accurate exclusion of hilar adenopathy is limited on<BR>noncontrast CT scans. No definitive evidence of direct mediastinal<BR>invasion from the right upper lobe mass on today's non contrast CT<BR>examination. Esophagus is unremarkable in appearance. Right internal<BR>jugular single-lumen porta cath with tip terminating at the superior<BR>cavoatrial junction.<BR> <BR>Lungs/Pleura: Significant decrease in size of right upper lobe mass<BR>which currently measures 2.7 x 2.9 cm. This mass  remains intimately<BR>associated with the pleural surface abutting the mediastinum,<BR>without definite evidence of direct mediastinal invasion. No other<BR>new suspicious appearing pulmonary nodules or masses are noted to<BR>suggest metastatic disease to the lungs at this time. No acute<BR>consolidative airspace disease. No pleural effusions. Mild diffuse<BR>bronchial wall thickening with mild centrilobular emphysema. Bulla<BR>in the medial aspect of the left upper lobe incidentally noted.<BR> <BR>Upper Abdomen: 2.1 cm rim calcified gallstone in the gallbladder.<BR>Previously described low-attenuation hepatic lesions in the left<BR>lobe of the liver are not confidently identified on today's non<BR>contrast CT examination.<BR> <BR>Musculoskeletal: There are no aggressive appearing lytic or blastic<BR>lesions noted in the visualized portions of the skeleton. Small<BR>sclerotic lesion in the lateral aspect of the left third rib is<BR>unchanged compared to prior examinations, and was not hypermetabolic<BR>on prior PET-CT 03/18/2013, presumably a small bone island.<BR> <BR>IMPRESSION:<BR>1. Today's study demonstrates a positive response to therapy with<BR>further decreased size of right upper lobe lesion which is now a 2.9<BR>x 2.7 cm pleural-based nodule. No new signs of additional metastatic<BR>disease are identified within the thorax. The previously noted<BR>subcentimeter low attenuation lesions in the left lobe of the liver<BR>are not confidently identified on today's non contrast CT<BR>examination, which could suggest regression of the lesions or could<BR>be a limitation of the noncontrast study.<BR>2. Mild diffuse bronchial wall thickening with mild centrilobular<BR>emphysema.<BR>3. Cholelithiasis without evidence to suggest acute cholecystitis at<BR>this time.<BR>4. Additional incidental findings, as above.<BR> <BR> <  BR>Electronically Signed<BR>By: Vinnie Langton M.D.<BR>On: 07/25/2013 15:13<BR>     4/29/2015CLINICAL DATA: Followup lung mass  EXAM: CT CHEST WITH CONTRAST  TECHNIQUE: Multidetector CT imaging of the chest was performed during intravenous contrast administration.  CONTRAST: 75 cc of Isovue 370  COMPARISON: 03/02/2013  FINDINGS: Right upper lobe lung mass measures 3.9 cm, image 14/ series 2 previously 8.6 cm. There is no pleural effusion. 3 mm right upper lobe nodule is new from previous exam, image 36/series 3. The left lung is clear. Normal heart size. No pericardial effusion. No enlarged mediastinal or hilar lymph nodes. There is no axillary or supraclavicular lymph nodes. Incidental imaging through the upper abdomen is on unremarkable. New focus of low attenuation within the lateral segment of left hepatic lobe measures 8 mm, image 55/series 2. Also new from previous exam is a 6 mm low attenuation structure along the dome of the liver. The adrenal glands are both normal. Review of the visualized bony structures is on unremarkable. No aggressive lytic or sclerotic bone lesions.  IMPRESSION: 1. Decrease in size of right upper lobe lung mass. 2. There are 2 new low attenuation structures within the lateral segment of left hepatic lobe. Suspicious for liver metastasis.   Electronically Signed By: Kerby Moors M.D. On: 06/22/2013 16:03   <HTML><META HTTP-EQUIV="content-type" CONTENT="text/html;charset=utf-8"><P>CLINICAL DATA: Small cell lung cancer. Evaluate for liver<BR>metastasis.<BR> <BR>EXAM:<BR>MRI ABDOMEN WITHOUT AND WITH CONTRAST<BR> <BR>TECHNIQUE:<BR>Multiplanar multisequence MR imaging of the abdomen was performed<BR>both before and after the administration of intravenous contrast.<BR> <BR>CONTRAST: 17 mL MultiHance<BR> <BR>COMPARISON: CT 03/28/2013<BR> <BR>FINDINGS:<BR>There is a small lesion in the posterior right hepatic lobe . This<BR>lesion measures 8 mm (image 14, series 3) and has high signal<BR>intensity on T2 weighted imaging. Patient  demonstrate peripheral<BR>initial enhancement and delayed uniform enhancement most consistent<BR>with a small hemangioma. No additional enhancing lesion present in<BR>the liver. No duct dilatation.<BR> <BR>There is a large gallstone within the gallbladder measuring 16 mm.<BR>The pancreas, spleen, adrenal glands, kidneys are normal.<BR> <BR>The stomach, and limited view of the small bowel and colon are<BR>unremarkable. No retroperitoneal lymphadenopathy. Lung bases clear.<BR>No aggressive osseous lesion.<BR> <BR>IMPRESSION:<BR>1. Small enhancing lesion in the right hepatic lobe is most<BR>consistent with a benign hemangioma. This corresponds to the<BR>indeterminate lesion on comparison CT.<BR> <BR>2. 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 - Discussed surgical resection with patient, Considering scan done June 1 in a dramatic response the patient has had been without evidence of distant metastasis I discussed with him attempting surgical resection of the residual tumor. Alternative treatment could consist of radiation therapy in this has been discussed with them also. I recommended to him that we proceed with attempted resection of the right upper lobe mass and what appears to be more localized mediastinal invasion but potentially could be resected  compared to his  initial presentation in January of 2015. The patient  Considered options and discussed with oncology and is willing to proceed with surgery.  Caval filter placed last week because of recent dx of dvt.  The goals risks and alternatives of the planned surgical procedure bronchoscopy and right lung resection  have been discussed with the patient in detail. The risks of the procedure including death, infection, stroke, myocardial infarction, bleeding, blood transfusion have all been discussed specifically.  I have quoted Sherian Maroon a 5 % of perioperative mortality and a complication rate as high as 25 %. The patient's questions have been answered.Kenston Longton is willing  to proceed with the planned procedure.    Grace Isaac MD      [address] [place] [address] Office [phone]   Beeper [phone]  09/05/2013 7:19 AM

## 2013-09-06 ENCOUNTER — Inpatient Hospital Stay (HOSPITAL_COMMUNITY): Payer: 59

## 2013-09-06 ENCOUNTER — Encounter (HOSPITAL_COMMUNITY): Payer: Self-pay | Admitting: Cardiothoracic Surgery

## 2013-09-06 LAB — CBC
HCT: 51.6 % (ref 39.0–52.0)
Hemoglobin: 16.7 g/dL (ref 13.0–17.0)
MCH: 37.2 pg — ABNORMAL HIGH (ref 26.0–34.0)
MCHC: 32.4 g/dL (ref 30.0–36.0)
MCV: 114.9 fL — ABNORMAL HIGH (ref 78.0–100.0)
Platelets: 98 K/uL — ABNORMAL LOW (ref 150–400)
RBC: 4.49 MIL/uL (ref 4.22–5.81)
RDW: 17 % — ABNORMAL HIGH (ref 11.5–15.5)
WBC: 8.7 K/uL (ref 4.0–10.5)

## 2013-09-06 LAB — BASIC METABOLIC PANEL
ANION GAP: 12 (ref 5–15)
BUN: 16 mg/dL (ref 6–23)
CHLORIDE: 99 meq/L (ref 96–112)
CO2: 25 mEq/L (ref 19–32)
Calcium: 8.6 mg/dL (ref 8.4–10.5)
Creatinine, Ser: 1.56 mg/dL — ABNORMAL HIGH (ref 0.50–1.35)
GFR calc non Af Amer: 51 mL/min — ABNORMAL LOW (ref >90)
GFR, EST AFRICAN AMERICAN: 59 mL/min — AB (ref >90)
Glucose, Bld: 140 mg/dL — ABNORMAL HIGH (ref 70–99)
POTASSIUM: 4.2 meq/L (ref 3.7–5.3)
Sodium: 136 mEq/L — ABNORMAL LOW (ref 137–147)

## 2013-09-06 LAB — BLOOD GAS, ARTERIAL
Acid-base deficit: 0.1 mmol/L (ref 0.0–2.0)
Bicarbonate: 24.6 meq/L — ABNORMAL HIGH (ref 20.0–24.0)
Drawn by: 36496
FIO2: 0.28 %
O2 Saturation: 98.2 %
Patient temperature: 98.6
TCO2: 26 mmol/L (ref 0–100)
pCO2 arterial: 44.6 mmHg (ref 35.0–45.0)
pH, Arterial: 7.361 (ref 7.350–7.450)
pO2, Arterial: 115 mmHg — ABNORMAL HIGH (ref 80.0–100.0)

## 2013-09-06 NOTE — Progress Notes (Signed)
Pt arrived from 2S, VSS, will continue to monitor.

## 2013-09-06 NOTE — Progress Notes (Signed)
Transferred to 3S10 via wheelchair. Portable monitor and oxygen on. No changes. Report given to Burman Nieves, RN

## 2013-09-06 NOTE — Progress Notes (Signed)
Patient ID: Karl Wise, male   DOB: October 17, 1965, 48 y.o.   MRN: [id number] [facility] DAILY ICU PROGRESS NOTE                   [address]            [place] [address]          [phone]   1 Day Post-Op Procedure(s) (LRB): VIDEO BRONCHOSCOPY (N/A) VIDEO ASSISTED THORACOSCOPY (VATS); Right Thoracotomy; Right Upper Lobectomy with Node Dissection and Enblock Resection of Mediastinal Invasion and Placement of OnQ Pain Pump (Right)  Total Length of Stay:  LOS: 1 day   Subjective: Up to chair, good pain control, not respiratory distress  Objective: Vital signs in last 24 hours: Temp:  [97.3 F (36.3 C)-99 F (37.2 C)] 99 F (37.2 C) (07/14 0733) Pulse Rate:  [43-69] 53 (07/14 0700) Cardiac Rhythm:  [-] Sinus bradycardia (07/14 0000) Resp:  [11-22] 14 (07/14 0700) BP: (130-184)/(73-100) 150/84 mmHg (07/14 0700) SpO2:  [95 %-100 %] 98 % (07/14 0700) Arterial Line BP: (103-192)/(71-145) 109/85 mmHg (07/13 2000) Weight:  [183 lb (83.008 kg)-184 lb 15.5 oz (83.9 kg)] 184 lb 15.5 oz (83.9 kg) (07/14 0600)  Filed Weights   09/05/13 1012 09/06/13 0600  Weight: 183 lb (83.008 kg) 184 lb 15.5 oz (83.9 kg)    Weight change:    Hemodynamic parameters for last 24 hours:    Intake/Output from previous day: 07/13 0701 - 07/14 0700 In: 3939.5 [P.O.:30; I.V.:3859.5; IV Piggyback:50] Out: 2285 [Urine:1435; Emesis/NG output:300; Blood:100; Chest Tube:450]  Intake/Output this shift:    Current Meds: Scheduled Meds: . acetaminophen  1,000 mg Oral 4 times per day   Or  . acetaminophen (TYLENOL) oral liquid 160 mg/5 mL  1,000 mg Oral 4 times per day  . antiseptic oral rinse  15 mL Mouth Rinse BID  . bisacodyl  10 mg Oral Daily  . cefUROXime (ZINACEF)  IV  1.5 g Intravenous Q12H  . fentaNYL   Intravenous 6 times per day  . nebivolol  5 mg Oral Daily  . pantoprazole  40 mg Oral Daily  . senna-docusate  1 tablet Oral QHS   Continuous Infusions: . dextrose 5 % and 0.9%  NaCl 125 mL/hr at 09/06/13 0200   PRN Meds:.albuterol, diphenhydrAMINE, diphenhydrAMINE, naloxone, ondansetron (ZOFRAN) IV, oxyCODONE, potassium chloride, sodium chloride, traMADol  General appearance: alert, cooperative and no distress Neurologic: intact Heart: regular rate and rhythm, S1, S2 normal, no murmur, click, rub or gallop Lungs: diminished breath sounds bibasilar Abdomen: soft, non-tender; bowel sounds normal; no masses,  no organomegaly Extremities: extremities normal, atraumatic, no cyanosis or edema and Homans sign is negative, no sign of DVT Wound: no air leak from chest tubes  Lab Results: CBC: Recent Labs  09/05/13 0957 09/06/13 0430  WBC  --  8.7  HGB 18.4* 16.7  HCT 54.0* 51.6  PLT  --  98*   BMET:  Recent Labs  09/05/13 0957 09/06/13 0430  NA 135* 136*  K 4.3 4.2  CL  --  99  CO2  --  25  GLUCOSE  --  140*  BUN  --  16  CREATININE  --  1.56*  CALCIUM  --  8.6    PT/INR: No results found for this basename: LABPROT, INR,  in the last 72 hours Radiology: Chest 2 View  09/05/2013   CLINICAL DATA:  Preoperative evaluation, right lung surgery.  EXAM: CHEST  2 VIEW  COMPARISON:  None.  FINDINGS: The heart size and mediastinal contours are within normal limits. Both lungs are clear. Single lumen right chest Port-A-Cath with distal tip projecting in mid superior vena cava. The visualized skeletal structures are unremarkable.  IMPRESSION: No active cardiopulmonary disease.   Electronically Signed   By: Elon Alas   On: 09/05/2013 06:16   Dg Chest Port 1 View  09/05/2013   CLINICAL DATA:  Status post right upper lobectomy, video-assisted thoracoscopic surgery procedure  EXAM: PORTABLE CHEST - 1 VIEW  COMPARISON:  Preoperative imaging same date  FINDINGS: 2 right-sided chest tubes are in place with tips over the right lung apex right-sided Port-A-Cath remains in place with tip over the distal SVC right IJ central line tip is partly obscured by the overlying  Port-A-Cath but appears to also terminates over the distal SVC. No pneumothorax. Heart size is normal. Evidence of right upper lobectomy. Trace bilateral pleural fluid. Right subcutaneous emphysema. Hypoaeration with curvilinear left greater than right lower lobe probable atelectasis.  IMPRESSION: Postoperative findings as above.  No pneumothorax.   Electronically Signed   By: Conchita Paris M.D.   On: 09/05/2013 13:16     Assessment/Plan: S/P Procedure(s) (LRB): VIDEO BRONCHOSCOPY (N/A) VIDEO ASSISTED THORACOSCOPY (VATS); Right Thoracotomy; Right Upper Lobectomy with Node Dissection and Enblock Resection of Mediastinal Invasion and Placement of OnQ Pain Pump (Right) Mobilize Diuresis See progression orders Renal function stable     Kandie Keiper B 09/06/2013 7:36 AM

## 2013-09-06 NOTE — Anesthesia Postprocedure Evaluation (Signed)
  Anesthesia Post-op Note  Patient: Sherian Maroon  Procedure(s) Performed: Procedure(s): VIDEO BRONCHOSCOPY (N/A) VIDEO ASSISTED THORACOSCOPY (VATS); Right Thoracotomy; Right Upper Lobectomy with Node Dissection and Enblock Resection of Mediastinal Invasion and Placement of OnQ Pain Pump (Right)  Patient Location: PACU  Anesthesia Type:General  Level of Consciousness: awake and alert   Airway and Oxygen Therapy: Patient Spontanous Breathing  Post-op Pain: mild  Post-op Assessment: Post-op Vital signs reviewed, Patient's Cardiovascular Status Stable and Respiratory Function Stable  Post-op Vital Signs: stable  Last Vitals:  Filed Vitals:   09/06/13 0600  BP: 174/91  Pulse: 46  Temp:   Resp: 18    Complications: No apparent anesthesia complications

## 2013-09-07 ENCOUNTER — Inpatient Hospital Stay (HOSPITAL_COMMUNITY): Payer: 59

## 2013-09-07 LAB — CBC
HCT: 50.4 % (ref 39.0–52.0)
HEMOGLOBIN: 16.3 g/dL (ref 13.0–17.0)
MCH: 37.1 pg — ABNORMAL HIGH (ref 26.0–34.0)
MCHC: 32.3 g/dL (ref 30.0–36.0)
MCV: 114.8 fL — ABNORMAL HIGH (ref 78.0–100.0)
Platelets: 94 10*3/uL — ABNORMAL LOW (ref 150–400)
RBC: 4.39 MIL/uL (ref 4.22–5.81)
RDW: 17 % — ABNORMAL HIGH (ref 11.5–15.5)
WBC: 8.7 10*3/uL (ref 4.0–10.5)

## 2013-09-07 LAB — COMPREHENSIVE METABOLIC PANEL
ALK PHOS: 76 U/L (ref 39–117)
ALT: 9 U/L (ref 0–53)
AST: 23 U/L (ref 0–37)
Albumin: 3 g/dL — ABNORMAL LOW (ref 3.5–5.2)
Anion gap: 15 (ref 5–15)
BUN: 14 mg/dL (ref 6–23)
CALCIUM: 8.8 mg/dL (ref 8.4–10.5)
CO2: 23 meq/L (ref 19–32)
Chloride: 95 mEq/L — ABNORMAL LOW (ref 96–112)
Creatinine, Ser: 1.54 mg/dL — ABNORMAL HIGH (ref 0.50–1.35)
GFR calc non Af Amer: 52 mL/min — ABNORMAL LOW (ref >90)
GFR, EST AFRICAN AMERICAN: 60 mL/min — AB (ref >90)
GLUCOSE: 106 mg/dL — AB (ref 70–99)
Potassium: 4.5 mEq/L (ref 3.7–5.3)
Sodium: 133 mEq/L — ABNORMAL LOW (ref 137–147)
TOTAL PROTEIN: 6.3 g/dL (ref 6.0–8.3)
Total Bilirubin: 1 mg/dL (ref 0.3–1.2)

## 2013-09-07 MED ORDER — HYDROCHLOROTHIAZIDE 25 MG PO TABS
25.0000 mg | ORAL_TABLET | Freq: Every day | ORAL | Status: DC
  Administered 2013-09-07 – 2013-09-09 (×3): 25 mg via ORAL
  Filled 2013-09-07 (×3): qty 1

## 2013-09-07 NOTE — Progress Notes (Addendum)
[facility] DAILY ICU PROGRESS NOTE                   [address]            [place] [address]          [phone]   2 Days Post-Op Procedure(s) (LRB): VIDEO BRONCHOSCOPY (N/A) VIDEO ASSISTED THORACOSCOPY (VATS); Right Thoracotomy; Right Upper Lobectomy with Node Dissection and Enblock Resection of Mediastinal Invasion and Placement of OnQ Pain Pump (Right)  Total Length of Stay:  LOS: 2 days   Subjective: Pain pretty well controlled   Objective: Vital signs in last 24 hours: Temp:  [97.6 F (36.4 C)-98.4 F (36.9 C)] 97.6 F (36.4 C) (07/15 0730) Pulse Rate:  [44-56] 49 (07/15 0730) Cardiac Rhythm:  [-] Sinus bradycardia (07/15 0830) Resp:  [15-24] 17 (07/15 0730) BP: (137-176)/(83-97) 151/84 mmHg (07/15 0730) SpO2:  [93 %-98 %] 97 % (07/15 0730)  Filed Weights   09/05/13 1012 09/06/13 0600  Weight: 183 lb (83.008 kg) 184 lb 15.5 oz (83.9 kg)    Weight change:    Hemodynamic parameters for last 24 hours:    Intake/Output from previous day: 07/14 0701 - 07/15 0700 In: 500 [P.O.:200; I.V.:250; IV Piggyback:50] Out: 795 [Urine:675; Chest Tube:120]  Intake/Output this shift:    Current Meds: Scheduled Meds: . acetaminophen  1,000 mg Oral 4 times per day   Or  . acetaminophen (TYLENOL) oral liquid 160 mg/5 mL  1,000 mg Oral 4 times per day  . antiseptic oral rinse  15 mL Mouth Rinse BID  . bisacodyl  10 mg Oral Daily  . fentaNYL   Intravenous 6 times per day  . nebivolol  5 mg Oral Daily  . pantoprazole  40 mg Oral Daily  . senna-docusate  1 tablet Oral QHS   Continuous Infusions: . dextrose 5 % and 0.9% NaCl 50 mL/hr at 09/06/13 1200   PRN Meds:.albuterol, diphenhydrAMINE, diphenhydrAMINE, naloxone, ondansetron (ZOFRAN) IV, oxyCODONE, potassium chloride, sodium chloride, traMADol  General appearance: alert, cooperative and no distress Heart: regular rate and rhythm Lungs: clear to auscultation bilaterally Abdomen: benign Extremities: pas  in place Wound: dressing CDI  Lab Results: CBC: Recent Labs  09/06/13 0430 09/07/13 0510  WBC 8.7 8.7  HGB 16.7 16.3  HCT 51.6 50.4  PLT 98* 94*   BMET:  Recent Labs  09/06/13 0430 09/07/13 0510  NA 136* 133*  K 4.2 4.5  CL 99 95*  CO2 25 23  GLUCOSE 140* 106*  BUN 16 14  CREATININE 1.56* 1.54*  CALCIUM 8.6 8.8    PT/INR: No results found for this basename: LABPROT, INR,  in the last 72 hours Radiology: Dg Chest Port 1 View  09/07/2013   CLINICAL DATA:  Status post resection of right upper lobe lung carcinoma.  EXAM: PORTABLE CHEST - 1 VIEW  COMPARISON:  09/06/2013  FINDINGS: Right chest tubes remain present with no evidence of pneumothorax. Central line and Port-A-Cath positioning is stable. There is mild increase in left basilar atelectasis. No edema or pleural fluid identified. The heart size and mediastinal contours are stable. There may be a small amount of mediastinal air present adjacent to the aortic knob in the region of the AP window.  IMPRESSION: No pneumothorax. Mild worsening of left basilar atelectasis. Potential small amount of air in the mediastinum.   Electronically Signed   By: Aletta Edouard M.D.   On: 09/07/2013 07:58   Dg Chest [facility]  09/06/2013   CLINICAL DATA:  Status post VATS with chest tube placement  EXAM: PORTABLE CHEST - 1 VIEW  COMPARISON:  Portable chest X ray of September 05, 2013  FINDINGS: Two right-sided chest tubes are unchanged with their tips in the apex. The Port-A-Cath appliance tip lies in the midportion of the SVC. The right internal jugular venous catheter tip also lies in the midportion of the SVC. There are surgical clips in the right hilum. An additional tubular structure is demonstrated overlying the right apex which may be related to the OnQ pain pump. There is no pneumothorax or significant pleural effusion on the right. On the left there is stable atelectasis or scarring just above the hemidiaphragm. The cardiac silhouette and  pulmonary vascularity are normal.  IMPRESSION: There is no pneumothorax. The support tubes and lines are in appropriate position.   Electronically Signed   By: Martine  Martinique   On: 09/06/2013 08:19   Dg Chest Port 1 View  09/05/2013   CLINICAL DATA:  Status post right upper lobectomy, video-assisted thoracoscopic surgery procedure  EXAM: PORTABLE CHEST - 1 VIEW  COMPARISON:  Preoperative imaging same date  FINDINGS: 2 right-sided chest tubes are in place with tips over the right lung apex right-sided Port-A-Cath remains in place with tip over the distal SVC right IJ central line tip is partly obscured by the overlying Port-A-Cath but appears to also terminates over the distal SVC. No pneumothorax. Heart size is normal. Evidence of right upper lobectomy. Trace bilateral pleural fluid. Right subcutaneous emphysema. Hypoaeration with curvilinear left greater than right lower lobe probable atelectasis.  IMPRESSION: Postoperative findings as above.  No pneumothorax.   Electronically Signed   By: Conchita Paris M.D.   On: 09/05/2013 13:16     Assessment/Plan: S/P Procedure(s) (LRB): VIDEO BRONCHOSCOPY (N/A) VIDEO ASSISTED THORACOSCOPY (VATS); Right Thoracotomy; Right Upper Lobectomy with Node Dissection and Enblock Resection of Mediastinal Invasion and Placement of OnQ Pain Pump (Right)  1 hypertensive, will restart preop  Dose of hydrodiuril 2 d/c post CT 3 KVO IV fluid 4 push pulm toilet/rehab 5 labs stable    GOLD,[name] 09/07/2013 9:44 AM   I have seen and examined Karl Wise and agree with the above assessment  and plan.  Grace Isaac MD Beeper [phone] Office [phone] 09/07/2013 6:09 PM

## 2013-09-07 NOTE — Op Note (Signed)
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  SURGICAL PROCEDURE:  Video bronchoscopy, right video-assisted thoracoscopy, right thoracotomy, right upper lobe resection with en bloc resection of mediastinal involvement and lymph node dissection, placement of On-Q device.  SURGEON:  Lanelle Bal, MD.  FIRST ASSISTANT:  John Giovanni, PA-C  BRIEF HISTORY:  The patient is a 48 year old male, who in early 2015, presented with a 9 cm mass in the right upper lobe, highly suspicious for mediastinal involvement.  Ultimately, the needle biopsy was performed with poorly differentiated carcinoma and the patient underwent 4 cycles of chemotherapy with traumatic shrinkage of that mass to approximate 2 cm in size.  A PET scan was performed that showed no evidence of distant metastasis and surgical resection was discussed with the patient, who is agreeable and signed informed consent.  DESCRIPTION OF PROCEDURE:  The patient underwent general endotracheal anesthesia with a single-lumen endotracheal tube.  After appropriate time-out, a video bronchoscopy was performed to the subsegmental level without evidence of endobronchial lesions.  The scope was removed. Double lumen endotracheal tube was placed, and the patient was turned in the lateral decubitus position with the right side up.  Initially, a small port site was created in the approximately fourth intercostal space, midaxillary line.  A 30 degree scope was introduced into the chest and the chest  cavity was examined in the mediastinum above the azygos vein.  The right upper lobe did appear adherent to the mediastinum consistent with the preoperative CT scan.  We then proceeded with enlarging the incision to a small thoracotomy and through this incision, and with the assistance of scopes proceeded with excising the upper lobe mass from the mediastinum.  The azygos vein and the superior vena cava bordered the area of involvement, but neither was actively adherent to the mass with a somewhat tedious dissection, we were ultimately able to free the mass from the chest wall.  This is superior most area.  Additional portions of the mediastinal tissue were removed and submitted separately.  With the right upper lobe freed from the mediastinum, we then proceeded with a standard right upper lobectomy dividing the superior pulmonary vein.  With the stapler, the fissures were fairly complete and were divided with purple stapler.  The pulmonary artery branches to the upper lobe were divided individually with a gold tip vascular stapler.  A black-loaded stapler was used on the right upper lobe bronchus.  The stapler was closed and the right lung was ventilated with air to the middle and lower lobe without difficulty.  The upper lobe bronchus was unstable and the upper lobe removed with the en bloc portion of the mediastinal involvement.  Frozen section on the bronchial margins were negative.  Frozen section on the site marked with a black suture for the mediastinal involvement were reported by pathology as being negative for tumor.  This was somewhat surprising and may be unrelated to treatment effects of the tumor for future reference.  Large clips were placed in the area of mediastinal involvement for future radiation therapy marking if this becomes necessary.  We then proceeded with removal of 10R, 11R and 12R lymph nodes each submitted in separate specimen cups and On-Q device was tunneled  superiorly, posteriorly.  The inferior pulmonary ligament was divided to free the lower lobe.  Two 28 chest tubes were left in place, 1 a standard chest tube and 1 a Blake drain.  The ribs were reapproximated with pericostal stitches and drilled through the bone. The lung was reinflated.  The ribs were reapproximated.  The incision closed in the muscle layers with interrupted 0 Vicryl, running 3-0 Vicryl in the subcutaneous tissue and a 3-0 subcuticular stitch in skin edges.  Dermabond was applied to the incision.  Estimated blood loss was approximately 100 mL.  The patient did not require any blood bank blood products during the operative procedure.  Sponge and needle count was reported as correct at completion of the procedure.  The patient tolerated the procedure without complication, was extubated in the operating room and transferred to the recovery room for postoperative observation.     Lanelle Bal, MD     EG/MEDQ  D:  09/06/2013  T:  09/06/2013  Job:  [id number]

## 2013-09-07 NOTE — Progress Notes (Signed)
Pt posterior chest tube d/c per MD order, pt tol well, pt educated to notify RN if any changes ie. SOB, CP or feeling of doom, nursing will cont to monitor

## 2013-09-08 ENCOUNTER — Encounter (HOSPITAL_COMMUNITY): Payer: Self-pay | Admitting: Physician Assistant

## 2013-09-08 ENCOUNTER — Inpatient Hospital Stay (HOSPITAL_COMMUNITY): Payer: 59

## 2013-09-08 DIAGNOSIS — I82402 Acute embolism and thrombosis of unspecified deep veins of left lower extremity: Secondary | ICD-10-CM | POA: Diagnosis present

## 2013-09-08 MED ORDER — RIVAROXABAN 20 MG PO TABS
20.0000 mg | ORAL_TABLET | Freq: Every day | ORAL | Status: DC
  Administered 2013-09-08: 20 mg via ORAL
  Filled 2013-09-08 (×2): qty 1

## 2013-09-08 MED ORDER — RIVAROXABAN 20 MG PO TABS
20.0000 mg | ORAL_TABLET | Freq: Every day | ORAL | Status: DC
  Filled 2013-09-08: qty 1

## 2013-09-08 NOTE — Discharge Summary (Signed)
[address]       [place] [address]             [phone]              Discharge Summary  Name: Karl Wise DOB: 1965/08/13 48 y.o. MRN: [id number]   Admission Date: 09/05/2013 Discharge Date:     Admitting Diagnosis: Right upper lobe lung mass   Discharge Diagnosis:  Poorly differentiated carcinoma, right upper lobe  Past Medical History  Diagnosis Date  . Lung cancer     T3,N0,M0, CT-guided needle BX's positive for poorly differentiated carcinoma  . GERD (gastroesophageal reflux disease)   . Kidney stones   . Anemia   . Hypertension   . History of blood transfusion   . 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 male recently referred to Dr. Servando Snare for evaluation of a right upper lobe lung mass. In December 2014, the patient noted increasing cough. Chest xray and CT showed a large right upper chest mass measuring  9cm with a question of mediastinal invasion. Biopsy was performed at [facility] which revealed a poorly differentiated carcinoma, EGFR and ALK negative. He was referred to oncology, Dr. Oliva Bustard,  and has completed 4 cycles of chemotherapy. Paclitaxel had to be stopped after 2 rounds due to allergic reaction. Follow up CT showed decrease in the size of the mass, but suggested two new new lesions in the liver. An MRI of the liver showed no evidence of liver mets. Functionally, the patient remains active, working in [employer]. He is a non-smoker and has no known occupational exposures.  He was referred to Dr. Servando Snare for thoracic surgical evaluation.  Dr. Servando Snare has followed the patient for several months. Rrecently the patient returned for evaluation, and it was felt that since he had responded  so well to chemotherapy, that he would benefit from surgical resection at this time. All risks, benefits and alternatives of surgery were explained in detail, and the patient agreed to proceed.    Hospital Course:  The patient was admitted to [facility] on 09/05/2013. The patient was taken to the operating room and underwent the above procedure.    The postoperative course has generally been uneventful.  Chest tubes have been removed in the standard fashion and follow up chest x-rays have been unremarkable.  The patient has been restarted on his home dose of Xarelto for a history of left leg DVT.  Final pathology remains pending at this time.  The patient has remained stable postoperatively.  Incisions are healing well.  He is tolerating a regular diet and is ambulating in the halls without difficulty.  We anticipate discharge home in 1-2 days provided no acute changes occur.     Recent vital signs:  Filed Vitals:   09/08/13 0731  BP: 124/77  Pulse: 59  Temp: 97.6 F (36.4 C)  Resp: 18    Recent laboratory studies:  CBC: Recent Labs  09/06/13 0430 09/07/13 0510  WBC 8.7 8.7  HGB 16.7 16.3  HCT 51.6 50.4  PLT 98* 94*   BMET:  Recent Labs  09/06/13 0430 09/07/13 0510  NA 136* 133*  K 4.2 4.5  CL 99 95*  CO2 25 23  GLUCOSE 140* 106*  BUN 16 14  CREATININE 1.56* 1.54*  CALCIUM 8.6 8.8    PT/INR: No results found for this basename: LABPROT, INR,  in the last 72 hours    Medication List         ALLEGRA PO  Take 1 tablet by mouth daily as needed (allergies).     hydrochlorothiazide 25 MG tablet  Commonly known as:  HYDRODIURIL  Take 25 mg by mouth daily.     nebivolol 5 MG tablet  Commonly known as:  BYSTOLIC  Take 5 mg by mouth daily.     oxyCODONE 5 MG immediate release tablet  Commonly known as:  Oxy IR/ROXICODONE  Take 1-2 tablets (5-10 mg total) by mouth every 4 (four) hours as needed for moderate pain or severe pain.     rivaroxaban 20 MG Tabs tablet    Commonly known as:  XARELTO  Take 20 mg by mouth daily with supper.     ZEGERID PO  Take 1 capsule by mouth daily as needed (acid reflux).         Discharge Instructions:  The patient is to refrain from driving, heavy lifting or strenuous activity.  May shower daily and clean incisions with soap and water.  May resume regular diet.   Follow Up: Follow-up Information   Follow up with Grace Isaac, MD On 09/29/2013. (Have a chest x-ray at [facility] at 2:00, then see MD at 3:00)    Specialty:  Cardiothoracic Surgery   Contact information:   [address] [place] [address] [phone]       Follow up with [facility] NURSE On 09/15/2013. (For suture removal at 10:00)            COLLINS,GINA H 09/08/2013, 9:39 AM

## 2013-09-08 NOTE — Progress Notes (Addendum)
       [address]       [place] [address]             [phone]          3 Days Post-Op Procedure(s) (LRB): VIDEO BRONCHOSCOPY (N/A) VIDEO ASSISTED THORACOSCOPY (VATS); Right Thoracotomy; Right Upper Lobectomy with Node Dissection and Enblock Resection of Mediastinal Invasion and Placement of OnQ Pain Pump (Right)  Subjective: Comfortable, no complaints.  Breathing stable, walking in halls without difficulty.   Objective: Vital signs in last 24 hours: Patient Vitals for the past 24 hrs:  BP Temp Temp src Pulse Resp SpO2  09/08/13 0731 124/77 mmHg 97.6 F (36.4 C) Oral 59 18 97 %  09/08/13 0417 156/95 mmHg 97.8 F (36.6 C) Oral 54 14 98 %  09/07/13 2312 155/92 mmHg 98.4 F (36.9 C) Oral 55 17 98 %  09/07/13 2000 168/98 mmHg 98.1 F (36.7 C) - 60 19 95 %  09/07/13 1447 146/83 mmHg 98.4 F (36.9 C) Oral 49 18 96 %  09/07/13 1148 - - - - 21 96 %   Current Weight  09/06/13 184 lb 15.5 oz (83.9 kg)     Intake/Output from previous day: 07/15 0701 - 07/16 0700 In: 1490.7 [P.O.:120; I.V.:1370.7] Out: 770 [Urine:750; Chest Tube:20]    PHYSICAL EXAM:  Heart: RRR Lungs: Clear Wound: Clean and dry Chest tube: No definite air leak, but some tidaling in chamber    Lab Results: CBC: Recent Labs  09/06/13 0430 09/07/13 0510  WBC 8.7 8.7  HGB 16.7 16.3  HCT 51.6 50.4  PLT 98* 94*   BMET:  Recent Labs  09/06/13 0430 09/07/13 0510  NA 136* 133*  K 4.2 4.5  CL 99 95*  CO2 25 23  GLUCOSE 140* 106*  BUN 16 14  CREATININE 1.56* 1.54*  CALCIUM 8.6 8.8    PT/INR: No results found for this basename: LABPROT, INR,  in the last 72 hours  CXR: FINDINGS:  One of the 2 chest tubes has been removed on the right. No  pneumothorax. Prior port in place. Central catheter tip in the SVC.  Slight increased atelectasis at the left lung base. Lungs are  otherwise clear.  IMPRESSION:  1. No pneumothorax.  2. Slight increased atelectasis at the  left lung base.     Assessment/Plan: S/P Procedure(s) (LRB): VIDEO BRONCHOSCOPY (N/A) VIDEO ASSISTED THORACOSCOPY (VATS); Right Thoracotomy; Right Upper Lobectomy with Node Dissection and Enblock Resection of Mediastinal Invasion and Placement of OnQ Pain Pump (Right)  CT with no definite air leak, CXR stable.  Hopefully can d/c remaining CT today.  HTN- back on home meds. Continue to monitor.  Pulm- continue IS/pulm, toilet.  H/o LLE DVT- pt was on Xarelto prior to surgery, but had an IVC filter placed recently. Hold Xarelto for now.  Continue ambulation, d/c On-Q, transition to po pain meds so PCA can be d/c'ed.   LOS: 3 days    COLLINS,GINA H 09/08/2013  Chest tube out today Restart xarelto for history of recend dvt,  Path still pending I have seen and examined Sherian Maroon and agree with the above assessment  and plan.  Grace Isaac MD Beeper [phone] Office [phone] 09/08/2013 9:24 AM

## 2013-09-09 ENCOUNTER — Inpatient Hospital Stay (HOSPITAL_COMMUNITY): Payer: 59

## 2013-09-09 MED ORDER — OXYCODONE HCL 5 MG PO TABS: 5.0000 mg | ORAL_TABLET | ORAL | Status: DC | PRN

## 2013-09-09 NOTE — Discharge Instructions (Signed)
Lung Resection Care After Refer to this sheet in the next few weeks. These instructions provide you with information on caring for yourself after your procedure. Your caregiver may also give you more specific instructions. Your treatment has been planned according to current medical practices, but problems sometimes occur. Call your caregiver if you have any problems or questions after your procedure. HOME CARE INSTRUCTIONS  You may resume a normal diet and activities as directed.  Do not smoke or use tobacco products.  Change your bandages (dressings) as directed.  Only take over-the-counter or prescription medicines for pain, discomfort, or fever as directed by your caregiver.  Keep all follow-up appointments as directed.  Try to breathe deeply and cough as directed. Holding a pillow firmly over your ribs may help with discomfort.  If you were given an incentive spirometer in the hospital, continue to use it as directed.  Walk as directed by your caregiver.  You may take a shower and gently wash the area of your surgical cut (incision) with water and soap as directed. Do not use anything else to clean your incision except as directed by your caregiver. Do not take baths or sit in a hot tub. SEEK MEDICAL CARE IF:  You notice redness, swelling, or increasing pain in the incision.  You are bleeding from the incision.  You see pus coming from the incision.  You notice a bad smell coming from the incision or dressing.  Your incision breaks open.  You cough up blood or pus, or you develop a cough that produces bad smelling sputum.  You have pain or swelling in your legs.  You have increasing pain that is not controlled with medicine.  You have trouble managing any of the tubes that have been left in place after surgery. SEEK IMMEDIATE MEDICAL CARE IF:   You have a fever or chills.  You have any reaction or side effects to medicines given.  You have chest pain or an  irregular or rapid heartbeat.  You have dizzy episodes or fainting.  You have shortness of breath or difficulty breathing.  You have persistent nausea or vomiting.  You have a rash. MAKE SURE YOU:  Understand these instructions.  Will watch your condition.  Will get help right away if you are not doing well or get worse. Document Released: 08/30/2004 Document Revised: 05/05/2011 Document Reviewed: 10/10/2010 [facility] Patient Information 2015 [place], [place]. This information is not intended to replace advice given to you by your health care provider. Make sure you discuss any questions you have with your health care provider.

## 2013-09-09 NOTE — Progress Notes (Signed)
[facility] DAILY ICU PROGRESS NOTE                   [address]            [place] [address]          [phone]   4 Days Post-Op Procedure(s) (LRB): VIDEO BRONCHOSCOPY (N/A) VIDEO ASSISTED THORACOSCOPY (VATS); Right Thoracotomy; Right Upper Lobectomy with Node Dissection and Enblock Resection of Mediastinal Invasion and Placement of OnQ Pain Pump (Right)  Total Length of Stay:  LOS: 4 days   Subjective: Pain controlled with oral analgesics  Objective: Vital signs in last 24 hours: Temp:  [97.8 F (36.6 C)-99 F (37.2 C)] 98.6 F (37 C) (07/17 0418) Pulse Rate:  [55-67] 66 (07/17 0418) Cardiac Rhythm:  [-] Sinus bradycardia (07/16 2000) Resp:  [18-21] 20 (07/17 0418) BP: (115-134)/(72-81) 123/72 mmHg (07/17 0418) SpO2:  [96 %-99 %] 99 % (07/17 0418)  Filed Weights   09/05/13 1012 09/06/13 0600  Weight: 183 lb (83.008 kg) 184 lb 15.5 oz (83.9 kg)    Weight change:    Hemodynamic parameters for last 24 hours:    Intake/Output from previous day: 07/16 0701 - 07/17 0700 In: 240 [P.O.:240] Out: 250 [Urine:250]  Intake/Output this shift:    Current Meds: Scheduled Meds: . acetaminophen  1,000 mg Oral 4 times per day   Or  . acetaminophen (TYLENOL) oral liquid 160 mg/5 mL  1,000 mg Oral 4 times per day  . antiseptic oral rinse  15 mL Mouth Rinse BID  . bisacodyl  10 mg Oral Daily  . hydrochlorothiazide  25 mg Oral Daily  . nebivolol  5 mg Oral Daily  . pantoprazole  40 mg Oral Daily  . rivaroxaban  20 mg Oral Q supper  . senna-docusate  1 tablet Oral QHS   Continuous Infusions:  PRN Meds:.albuterol, diphenhydrAMINE, diphenhydrAMINE, naloxone, ondansetron (ZOFRAN) IV, oxyCODONE, potassium chloride, sodium chloride, traMADol  General appearance: alert, cooperative and no distress Heart: regular rate and rhythm Lungs: clear to auscultation bilaterally Abdomen: benign Extremities: + LLE edema Wound: incis healing well  Lab  Results: CBC: Recent Labs  09/07/13 0510  WBC 8.7  HGB 16.3  HCT 50.4  PLT 94*   BMET:  Recent Labs  09/07/13 0510  NA 133*  K 4.5  CL 95*  CO2 23  GLUCOSE 106*  BUN 14  CREATININE 1.54*  CALCIUM 8.8    PT/INR: No results found for this basename: LABPROT, INR,  in the last 72 hours Radiology: Dg Chest Port 1 View  09/08/2013   CLINICAL DATA:  Lung mass.  Status post thoracotomy.  EXAM: PORTABLE CHEST - 1 VIEW  COMPARISON:  09/07/2013  FINDINGS: One of the 2 chest tubes has been removed on the right. No pneumothorax. Prior port in place. Central catheter tip in the SVC.  Slight increased atelectasis at the left lung base. Lungs are otherwise clear.  IMPRESSION: 1. No pneumothorax. 2. Slight increased atelectasis at the left lung base.   Electronically Signed   By: Rozetta Nunnery M.D.   On: 09/08/2013 07:44     Assessment/Plan: S/P Procedure(s) (LRB): VIDEO BRONCHOSCOPY (N/A) VIDEO ASSISTED THORACOSCOPY (VATS); Right Thoracotomy; Right Upper Lobectomy with Node Dissection and Enblock Resection of Mediastinal Invasion and Placement of OnQ Pain Pump (Right)  1 CXR appears stable 2 path still pending 3 no new labs 4 poss d/c home today    Draedyn Weidinger E 09/09/2013 7:50 AM

## 2013-09-09 NOTE — Care Management Note (Signed)
    Page 1 of 1   09/09/2013     11:47:43 AM CARE MANAGEMENT NOTE 09/09/2013  Patient:  Friedel,Antavion   Account Number:  [id number]  Date Initiated:  09/06/2013  Documentation initiated by:  MAYO,HENRIETTA  Subjective/Objective Assessment:   s/p VATS, (R) Thoracotomy (R)UL lobectomy     Action/Plan:   Anticipated DC Date:     Anticipated DC Plan:           Choice offered to / List presented to:             Status of service:  Completed, signed off Medicare Important Message given?  NO (If response is "NO", the following Medicare IM given date fields will be blank) Date Medicare IM given:   Medicare IM given by:   Date Additional Medicare IM given:   Additional Medicare IM given by:    Discharge Disposition:  HOME/SELF CARE  Per UR Regulation:  Reviewed for med. necessity/level of care/duration of stay  If discussed at [place] of Stay Meetings, dates discussed:    Comments:

## 2013-09-09 NOTE — Progress Notes (Signed)
Pt discharged per MD order, all discharge instructions reviewed and all questions answered.

## 2013-09-15 ENCOUNTER — Other Ambulatory Visit: Payer: Self-pay | Admitting: *Deleted

## 2013-09-15 ENCOUNTER — Ambulatory Visit (INDEPENDENT_AMBULATORY_CARE_PROVIDER_SITE_OTHER): Payer: Self-pay | Admitting: *Deleted

## 2013-09-15 DIAGNOSIS — I2510 Atherosclerotic heart disease of native coronary artery without angina pectoris: Secondary | ICD-10-CM

## 2013-09-15 DIAGNOSIS — Z0279 Encounter for issue of other medical certificate: Secondary | ICD-10-CM

## 2013-09-15 DIAGNOSIS — C3410 Malignant neoplasm of upper lobe, unspecified bronchus or lung: Secondary | ICD-10-CM

## 2013-09-15 DIAGNOSIS — Z4802 Encounter for removal of sutures: Secondary | ICD-10-CM

## 2013-09-15 DIAGNOSIS — R609 Edema, unspecified: Secondary | ICD-10-CM

## 2013-09-15 DIAGNOSIS — Z902 Acquired absence of lung [part of]: Secondary | ICD-10-CM

## 2013-09-15 DIAGNOSIS — G8918 Other acute postprocedural pain: Secondary | ICD-10-CM

## 2013-09-15 MED ORDER — FUROSEMIDE 20 MG PO TABS: 20.0000 mg | ORAL_TABLET | Freq: Every day | ORAL | Status: DC

## 2013-09-15 MED ORDER — OXYCODONE HCL 5 MG PO TABS: 5.0000 mg | ORAL_TABLET | ORAL | Status: DC | PRN

## 2013-09-15 NOTE — Progress Notes (Signed)
Karl Wise returns for suture removal from his two chest tube sites s/p RULobectomy 09/05/13.  These were easily removed.  The chest tube sites, as well as the thoracotomy incision, are very well healed. There is some residual bruising left around the entire operative site. He is using Oxycodone for pain and has requested a refill.  He has bilateral lower extremity swelling. He wanted to know the results of his pathology.  Dr. Servando Snare discussed this with him, ordered a  short treatment of Lasix for his edema and gave him a refill for his pain med.  He will return as scheduled in two weeks with a cxr.

## 2013-09-16 ENCOUNTER — Encounter: Payer: Self-pay | Admitting: *Deleted

## 2013-09-16 NOTE — Progress Notes (Signed)
Called pathology dept.  Tissue has not been sent to [facility].  I will clarify with Dr. Servando Snare if he would like this to be sent.

## 2013-09-28 ENCOUNTER — Other Ambulatory Visit: Payer: Self-pay | Admitting: Cardiothoracic Surgery

## 2013-09-28 DIAGNOSIS — J984 Other disorders of lung: Secondary | ICD-10-CM

## 2013-09-29 ENCOUNTER — Encounter: Payer: Self-pay | Admitting: Cardiothoracic Surgery

## 2013-09-29 ENCOUNTER — Ambulatory Visit
Admission: RE | Admit: 2013-09-29 | Discharge: 2013-09-29 | Disposition: A | Discharge status: Home / Self Care | Payer: 59 | Source: Ambulatory Visit | Attending: Cardiothoracic Surgery | Admitting: Cardiothoracic Surgery

## 2013-09-29 ENCOUNTER — Ambulatory Visit (INDEPENDENT_AMBULATORY_CARE_PROVIDER_SITE_OTHER): Payer: Self-pay | Admitting: Cardiothoracic Surgery

## 2013-09-29 VITALS — BP 147/92 | HR 49 | Resp 20 | Ht 66.0 in | Wt 184.0 lb

## 2013-09-29 DIAGNOSIS — G8918 Other acute postprocedural pain: Secondary | ICD-10-CM

## 2013-09-29 DIAGNOSIS — J984 Other disorders of lung: Secondary | ICD-10-CM

## 2013-09-29 DIAGNOSIS — Z9889 Other specified postprocedural states: Secondary | ICD-10-CM

## 2013-09-29 DIAGNOSIS — Z902 Acquired absence of lung [part of]: Secondary | ICD-10-CM

## 2013-09-29 DIAGNOSIS — C3410 Malignant neoplasm of upper lobe, unspecified bronchus or lung: Secondary | ICD-10-CM

## 2013-09-29 MED ORDER — OXYCODONE HCL 5 MG PO TABS: 5.0000 mg | ORAL_TABLET | ORAL | Status: DC | PRN

## 2013-09-29 NOTE — Progress Notes (Signed)
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  SURGICAL PROCEDURE: Video bronchoscopy, right video-assisted  thoracoscopy, right thoracotomy, right upper lobe resection with en bloc  resection of mediastinal involvement and lymph node dissection,  placement of On-Q device.  Lung cancer, right upper lobe   Primary site: Lung (Right)   Staging method: AJCC 7th Edition   Pathologic: Stage IIIA (T4, N0, cM0) signed by Grace Isaac, MD on 09/29/2013 11:30 AM   Summary: Stage IIIA (T4, N0, cM0)   History of Present Illness:     Patient is doing well postoperatively, he is still has some fatigue and chest wall discomfort but this is improving. He is returning to work part-time. He's not bothered by shortness of breath. Still has lower extremity edema left leg greater than the right. He continues on xarelto for preoperative DVT involving the left leg.    Past Medical History  Diagnosis Date  . 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 GERD (gastroesophageal reflux disease)   . Kidney stones   . Anemia   . Hypertension   . History of blood transfusion   . Left leg DVT     S/P IVC filter placement 7/10, on Xarelto     History  Smoking status  . Former Smoker -- 1.00 packs/day for 20 years  . Types: Cigarettes  . Quit date: 03/02/2005  Smokeless tobacco  . Never Used    History  Alcohol Use  . Yes    Comment: ocassionally     Allergies  Allergen Reactions  . Emend  [Aprepitant] Anaphylaxis  . Paclitaxel Anaphylaxis    Current Outpatient Prescriptions  Medication Sig Dispense Refill  . Fexofenadine HCl (ALLEGRA PO) Take 1 tablet by mouth daily as needed (allergies).      . hydrochlorothiazide (HYDRODIURIL) 25 MG tablet Take 25 mg by mouth daily.      . nebivolol (BYSTOLIC) 5 MG tablet Take 5 mg by mouth daily.      Earney Navy Bicarbonate (ZEGERID PO) Take 1 capsule by mouth daily as needed (acid reflux).      Marland Kitchen oxyCODONE (OXY IR/ROXICODONE) 5 MG immediate release tablet Take 1-2 tablets (5-10 mg total) by mouth every 4 (four) hours as needed for moderate pain or severe pain.  50 tablet  0  . rivaroxaban (XARELTO) 20 MG TABS tablet Take 20 mg by mouth daily with supper.       No current facility-administered medications for this visit.       Physical Exam: BP 147/92  Pulse 49  Resp 20  Ht 5\' 6"  (1.676 m)  Wt 184 lb (83.462 kg)  BMI 29.71 kg/m2  SpO2 98%  General appearance: alert, cooperative and appears stated age Neurologic: intact Heart: regular rate and rhythm, S1, S2 normal, no murmur, click, rub or gallop Lungs: clear to auscultation bilaterally Abdomen: soft, non-tender; bowel sounds normal; no masses,  no organomegaly Extremities: edema  Bilateral lower extremity edema slightly greater on the left than the right Wound: Right chest incisions are well-healed   Diagnostic Studies & Laboratory data:     Recent Radiology Findings:   Dg Chest 2 View  09/29/2013   CLINICAL DATA:  Carcinoma of the lung. Right upper lobectomy on 09/05/2013.  EXAM: CHEST  2 VIEW  COMPARISON:  09/09/2013  FINDINGS: There is no residual pneumothorax. Power port in place. Heart size and vascularity are normal. Lungs are clear. No effusions.  IMPRESSION: No acute abnormalities. Complete clearing of the atelectasis at the left lung base.   Electronically Signed   By: Rozetta Nunnery M.D.   On: 09/29/2013 12:21      Recent Lab Findings: Lab Results    Component Value Date   WBC 8.7 09/07/2013   HGB 16.3 09/07/2013   HCT 50.4 09/07/2013   PLT 94* 09/07/2013   GLUCOSE 106* 09/07/2013   ALT 9 09/07/2013   AST 23 09/07/2013   NA 133* 09/07/2013   K 4.5 09/07/2013   CL 95* 09/07/2013   CREATININE 1.54* 09/07/2013   BUN 14 09/07/2013   CO2 23 09/07/2013   INR 1.10 09/01/2013      Assessment / Plan:      Patient doing well following right upper lobectomy with poorly differentiated carcinoma invading mediastinum, but without direct invasion into the superior vena cava innominate vein or phrenic nerve.  The patient is to return to [facility] for followup treatment. I discussed with the patient consideration of postoperative radiation because of the close margins and known mediastinal involvement. Plan see the patient back in one month The resected specimen has not been sent for [facility], but can be at Dr. Jeb Levering request   Grace Isaac MD      [address] [place] [address] Office [phone]   Beeper [phone]  09/29/2013 12:59 PM

## 2013-10-14 ENCOUNTER — Ambulatory Visit: Payer: Self-pay | Admitting: Oncology

## 2013-10-14 LAB — CBC CANCER CENTER
BASOS ABS: 0.1 x10 3/mm (ref 0.0–0.1)
Basophil %: 1.2 %
EOS PCT: 1.5 %
Eosinophil #: 0.1 x10 3/mm (ref 0.0–0.7)
HCT: 46.6 % (ref 40.0–52.0)
HGB: 15.4 g/dL (ref 13.0–18.0)
Lymphocyte #: 1.2 x10 3/mm (ref 1.0–3.6)
Lymphocyte %: 24.4 %
MCH: 35.4 pg — AB (ref 26.0–34.0)
MCHC: 33 g/dL (ref 32.0–36.0)
MCV: 107 fL — AB (ref 80–100)
MONO ABS: 0.4 x10 3/mm (ref 0.2–1.0)
Monocyte %: 8.5 %
Neutrophil #: 3.2 x10 3/mm (ref 1.4–6.5)
Neutrophil %: 64.4 %
Platelet: 149 x10 3/mm — ABNORMAL LOW (ref 150–440)
RBC: 4.36 10*6/uL — AB (ref 4.40–5.90)
RDW: 16.1 % — AB (ref 11.5–14.5)
WBC: 5 x10 3/mm (ref 3.8–10.6)

## 2013-10-14 LAB — COMPREHENSIVE METABOLIC PANEL
Albumin: 3.6 g/dL (ref 3.4–5.0)
Alkaline Phosphatase: 113 U/L
Anion Gap: 7 (ref 7–16)
BUN: 19 mg/dL — AB (ref 7–18)
Bilirubin,Total: 0.6 mg/dL (ref 0.2–1.0)
CALCIUM: 9.1 mg/dL (ref 8.5–10.1)
Chloride: 100 mmol/L (ref 98–107)
Co2: 30 mmol/L (ref 21–32)
Creatinine: 2.04 mg/dL — ABNORMAL HIGH (ref 0.60–1.30)
EGFR (African American): 43 — ABNORMAL LOW
EGFR (Non-African Amer.): 37 — ABNORMAL LOW
Glucose: 152 mg/dL — ABNORMAL HIGH (ref 65–99)
Osmolality: 279 (ref 275–301)
POTASSIUM: 4.1 mmol/L (ref 3.5–5.1)
SGOT(AST): 24 U/L (ref 15–37)
SGPT (ALT): 22 U/L
Sodium: 137 mmol/L (ref 136–145)
TOTAL PROTEIN: 7.4 g/dL (ref 6.4–8.2)

## 2013-10-25 ENCOUNTER — Ambulatory Visit: Payer: Self-pay | Admitting: Oncology

## 2013-11-16 LAB — CBC CANCER CENTER
BASOS ABS: 0 x10 3/mm (ref 0.0–0.1)
Basophil %: 0.5 %
EOS PCT: 0.9 %
Eosinophil #: 0 x10 3/mm (ref 0.0–0.7)
HCT: 50.2 % (ref 40.0–52.0)
HGB: 16.1 g/dL (ref 13.0–18.0)
LYMPHS PCT: 13.7 %
Lymphocyte #: 0.7 x10 3/mm — ABNORMAL LOW (ref 1.0–3.6)
MCH: 34.3 pg — ABNORMAL HIGH (ref 26.0–34.0)
MCHC: 32 g/dL (ref 32.0–36.0)
MCV: 107 fL — ABNORMAL HIGH (ref 80–100)
MONO ABS: 0.4 x10 3/mm (ref 0.2–1.0)
MONOS PCT: 7.4 %
NEUTROS ABS: 4 x10 3/mm (ref 1.4–6.5)
NEUTROS PCT: 77.5 %
PLATELETS: 155 x10 3/mm (ref 150–440)
RBC: 4.68 10*6/uL (ref 4.40–5.90)
RDW: 15.6 % — AB (ref 11.5–14.5)
WBC: 5.2 x10 3/mm (ref 3.8–10.6)

## 2013-11-17 ENCOUNTER — Encounter (HOSPITAL_COMMUNITY): Payer: Self-pay

## 2013-11-24 ENCOUNTER — Ambulatory Visit: Payer: Self-pay | Admitting: Oncology

## 2013-11-29 ENCOUNTER — Other Ambulatory Visit: Payer: Self-pay | Admitting: Cardiothoracic Surgery

## 2013-11-29 DIAGNOSIS — C3411 Malignant neoplasm of upper lobe, right bronchus or lung: Secondary | ICD-10-CM

## 2013-12-01 ENCOUNTER — Encounter: Payer: Self-pay | Admitting: Cardiothoracic Surgery

## 2013-12-01 ENCOUNTER — Ambulatory Visit (INDEPENDENT_AMBULATORY_CARE_PROVIDER_SITE_OTHER): Payer: Self-pay | Admitting: Cardiothoracic Surgery

## 2013-12-01 ENCOUNTER — Ambulatory Visit: Payer: 59 | Admitting: Cardiothoracic Surgery

## 2013-12-01 ENCOUNTER — Ambulatory Visit: Payer: Self-pay

## 2013-12-01 ENCOUNTER — Ambulatory Visit
Admission: RE | Admit: 2013-12-01 | Discharge: 2013-12-01 | Disposition: A | Discharge status: Home / Self Care | Payer: 59 | Source: Ambulatory Visit | Attending: Cardiothoracic Surgery | Admitting: Cardiothoracic Surgery

## 2013-12-01 VITALS — BP 125/80 | HR 60 | Ht 66.0 in | Wt 184.0 lb

## 2013-12-01 DIAGNOSIS — G8918 Other acute postprocedural pain: Secondary | ICD-10-CM

## 2013-12-01 DIAGNOSIS — C3411 Malignant neoplasm of upper lobe, right bronchus or lung: Secondary | ICD-10-CM

## 2013-12-01 MED ORDER — OXYCODONE HCL 5 MG PO TABS: 5.0000 mg | ORAL_TABLET | ORAL | Status: DC | PRN

## 2013-12-01 NOTE — Progress Notes (Signed)
[address]       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  SURGICAL PROCEDURE: Video bronchoscopy, right video-assisted  thoracoscopy, right thoracotomy, right upper lobe resection with en bloc  resection of mediastinal involvement and lymph node dissection,  placement of On-Q device.  Lung cancer, right upper lobe   Primary site: Lung (Right)   Staging method: AJCC 7th Edition   Pathologic: Stage IIIA (T4, N0, cM0) signed by Grace Isaac, MD on 09/29/2013 11:30 AM   Summary: Stage IIIA (T4, N0, cM0)   History of Present Illness:     Patient is doing well postoperatively.  He has returned to work full-time. He's not bothered by shortness of breath.  He continues on xarelto for preoperative DVT involving the left leg. The patient did start radiation therapy postoperative however after 10 treatments he refused any more because of fatigue.    Past Medical History  Diagnosis Date  . 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 GERD (gastroesophageal reflux disease)   . Kidney stones   . Anemia   . Hypertension   . History of blood transfusion   . Left leg DVT     S/P IVC filter placement 7/10, on Xarelto     History  Smoking status  . Former Smoker -- 1.00 packs/day for 20 years  . Types: Cigarettes  . Quit date: 03/02/2005  Smokeless tobacco  . Never Used    History  Alcohol Use  . Yes    Comment: ocassionally     Allergies  Allergen Reactions  . Emend [Aprepitant]  Anaphylaxis  . Paclitaxel Anaphylaxis    Current Outpatient Prescriptions  Medication Sig Dispense Refill  . Fexofenadine HCl (ALLEGRA PO) Take 1 tablet by mouth daily as needed (allergies).      . hydrochlorothiazide (HYDRODIURIL) 25 MG tablet Take 25 mg by mouth daily.      . nebivolol (BYSTOLIC) 5 MG tablet Take 5 mg by mouth daily.      Earney Navy Bicarbonate (ZEGERID PO) Take 1 capsule by mouth daily as needed (acid reflux).      . rivaroxaban (XARELTO) 20 MG TABS tablet Take 20 mg by mouth daily with supper.      Marland Kitchen oxyCODONE (OXY IR/ROXICODONE) 5 MG immediate release tablet Take 1-2 tablets (5-10 mg total) by mouth every 4 (four) hours as needed for moderate pain or severe pain.  50 tablet  0   No current facility-administered medications for this visit.       Physical Exam: BP 125/80  Pulse 60  Ht 5\' 6"  (1.676 m)  Wt 184 lb (83.462 kg)  BMI 29.71 kg/m2  SpO2 98%  General appearance: alert, cooperative and appears stated age Neurologic: intact Heart: regular rate and rhythm, S1, S2 normal, no murmur, click, rub or gallop Lungs: clear to auscultation bilaterally Abdomen: soft, non-tender; bowel sounds normal; no masses,  no organomegaly Extremities: edema Bilateral lower extremity edema slightly greater on  the left than the right Wound: Right chest incisions are well-healed   Diagnostic Studies & Laboratory data:     Recent Radiology Findings:   Dg Chest 2 View  12/01/2013   CLINICAL DATA:  Malignant neoplasm of the right upper lobe. VATS 09/05/2013. Subsequent encounter  EXAM: CHEST  2 VIEW  COMPARISON:  09/29/2013  FINDINGS: Right IJ porta catheter in stable position, tip at the lower SVC. Surgical clips in the right peritracheal region related to mass resection.  No evidence of hemothorax, pneumothorax, or pneumonia.  Normal heart size and stable mediastinal contours.  Lateral right fifth and sixth rib callus, possibly from thoracotomy.  IVC filter.   IMPRESSION: Stable postoperative appearance of the chest.   Electronically Signed   By: Jorje Guild M.D.   On: 12/01/2013 15:40      Recent Lab Findings: Lab Results  Component Value Date   WBC 8.7 09/07/2013   HGB 16.3 09/07/2013   HCT 50.4 09/07/2013   PLT 94* 09/07/2013   GLUCOSE 106* 09/07/2013   ALT 9 09/07/2013   AST 23 09/07/2013   NA 133* 09/07/2013   K 4.5 09/07/2013   CL 95* 09/07/2013   CREATININE 1.54* 09/07/2013   BUN 14 09/07/2013   CO2 23 09/07/2013   INR 1.10 09/01/2013      Assessment / Plan:      Patient doing well following right upper lobectomy with poorly differentiated carcinoma invading mediastinum, but without direct invasion into the superior vena cava innominate vein or phrenic nerve.  The patient is followed [facility]/Dr. Jeb Levering for followup treatment. The patient still has his cable filter in place, since he is able to take xarelto without difficulty currently I talked to him about the pros and cons of having it removed. He'll discuss this with Dr. Jeb Levering,   Plan see the patient back in 3 months  Grace Isaac MD      [address] [place] [address] Office [phone]   Beeper [phone]  12/01/2013 4:15 PM

## 2013-12-06 LAB — COMPREHENSIVE METABOLIC PANEL
ANION GAP: 8 (ref 7–16)
Albumin: 3.9 g/dL (ref 3.4–5.0)
Alkaline Phosphatase: 84 U/L
BUN: 30 mg/dL — ABNORMAL HIGH (ref 7–18)
Bilirubin,Total: 1 mg/dL (ref 0.2–1.0)
CALCIUM: 8.9 mg/dL (ref 8.5–10.1)
Chloride: 100 mmol/L (ref 98–107)
Co2: 29 mmol/L (ref 21–32)
Creatinine: 2.06 mg/dL — ABNORMAL HIGH (ref 0.60–1.30)
GFR CALC AF AMER: 45 — AB
GFR CALC NON AF AMER: 37 — AB
Glucose: 120 mg/dL — ABNORMAL HIGH (ref 65–99)
Osmolality: 281 (ref 275–301)
Potassium: 3.9 mmol/L (ref 3.5–5.1)
SGOT(AST): 36 U/L (ref 15–37)
SGPT (ALT): 29 U/L
Sodium: 137 mmol/L (ref 136–145)
Total Protein: 7.5 g/dL (ref 6.4–8.2)

## 2013-12-06 LAB — CBC CANCER CENTER
BASOS ABS: 0 x10 3/mm (ref 0.0–0.1)
BASOS PCT: 0.9 %
EOS ABS: 0 x10 3/mm (ref 0.0–0.7)
Eosinophil %: 0.9 %
HCT: 44.6 % (ref 40.0–52.0)
HGB: 14.7 g/dL (ref 13.0–18.0)
Lymphocyte #: 1.1 x10 3/mm (ref 1.0–3.6)
Lymphocyte %: 19.6 %
MCH: 34.5 pg — ABNORMAL HIGH (ref 26.0–34.0)
MCHC: 32.8 g/dL (ref 32.0–36.0)
MCV: 105 fL — ABNORMAL HIGH (ref 80–100)
MONO ABS: 0.4 x10 3/mm (ref 0.2–1.0)
Monocyte %: 7.2 %
NEUTROS ABS: 4.1 x10 3/mm (ref 1.4–6.5)
NEUTROS PCT: 71.4 %
Platelet: 158 x10 3/mm (ref 150–440)
RBC: 4.25 10*6/uL — ABNORMAL LOW (ref 4.40–5.90)
RDW: 14.7 % — ABNORMAL HIGH (ref 11.5–14.5)
WBC: 5.7 x10 3/mm (ref 3.8–10.6)

## 2013-12-25 ENCOUNTER — Ambulatory Visit: Payer: Self-pay | Admitting: Oncology

## 2014-01-03 LAB — COMPREHENSIVE METABOLIC PANEL
ALBUMIN: 3.8 g/dL (ref 3.4–5.0)
ANION GAP: 5 — AB (ref 7–16)
Alkaline Phosphatase: 83 U/L
BILIRUBIN TOTAL: 0.6 mg/dL (ref 0.2–1.0)
BUN: 29 mg/dL — ABNORMAL HIGH (ref 7–18)
CALCIUM: 8.8 mg/dL (ref 8.5–10.1)
CHLORIDE: 104 mmol/L (ref 98–107)
Co2: 32 mmol/L (ref 21–32)
Creatinine: 2.09 mg/dL — ABNORMAL HIGH (ref 0.60–1.30)
EGFR (African American): 44 — ABNORMAL LOW
EGFR (Non-African Amer.): 36 — ABNORMAL LOW
Glucose: 119 mg/dL — ABNORMAL HIGH (ref 65–99)
Osmolality: 288 (ref 275–301)
Potassium: 4 mmol/L (ref 3.5–5.1)
SGOT(AST): 21 U/L (ref 15–37)
SGPT (ALT): 25 U/L
SODIUM: 141 mmol/L (ref 136–145)
TOTAL PROTEIN: 7.4 g/dL (ref 6.4–8.2)

## 2014-01-03 LAB — CBC CANCER CENTER
BASOS ABS: 0 x10 3/mm (ref 0.0–0.1)
Basophil %: 0.9 %
EOS PCT: 1.7 %
Eosinophil #: 0.1 x10 3/mm (ref 0.0–0.7)
HCT: 43.2 % (ref 40.0–52.0)
HGB: 14.5 g/dL (ref 13.0–18.0)
LYMPHS ABS: 1 x10 3/mm (ref 1.0–3.6)
Lymphocyte %: 19.9 %
MCH: 35.2 pg — AB (ref 26.0–34.0)
MCHC: 33.6 g/dL (ref 32.0–36.0)
MCV: 105 fL — ABNORMAL HIGH (ref 80–100)
MONOS PCT: 7.3 %
Monocyte #: 0.4 x10 3/mm (ref 0.2–1.0)
Neutrophil #: 3.6 x10 3/mm (ref 1.4–6.5)
Neutrophil %: 70.2 %
Platelet: 162 x10 3/mm (ref 150–440)
RBC: 4.12 10*6/uL — ABNORMAL LOW (ref 4.40–5.90)
RDW: 13.2 % (ref 11.5–14.5)
WBC: 5.2 x10 3/mm (ref 3.8–10.6)

## 2014-01-24 ENCOUNTER — Ambulatory Visit: Payer: Self-pay | Admitting: Oncology

## 2014-03-08 ENCOUNTER — Other Ambulatory Visit: Payer: Self-pay | Admitting: Cardiothoracic Surgery

## 2014-03-08 DIAGNOSIS — C3411 Malignant neoplasm of upper lobe, right bronchus or lung: Secondary | ICD-10-CM

## 2014-03-09 ENCOUNTER — Ambulatory Visit (INDEPENDENT_AMBULATORY_CARE_PROVIDER_SITE_OTHER): Payer: 59 | Admitting: Cardiothoracic Surgery

## 2014-03-09 ENCOUNTER — Encounter: Payer: Self-pay | Admitting: Cardiothoracic Surgery

## 2014-03-09 ENCOUNTER — Ambulatory Visit
Admission: RE | Admit: 2014-03-09 | Discharge: 2014-03-09 | Disposition: A | Discharge status: Home / Self Care | Payer: 59 | Source: Ambulatory Visit | Attending: Cardiothoracic Surgery | Admitting: Cardiothoracic Surgery

## 2014-03-09 VITALS — BP 129/88 | HR 56 | Resp 20 | Ht 66.0 in | Wt 193.0 lb

## 2014-03-09 DIAGNOSIS — C3411 Malignant neoplasm of upper lobe, right bronchus or lung: Secondary | ICD-10-CM

## 2014-03-09 DIAGNOSIS — Z9889 Other specified postprocedural states: Secondary | ICD-10-CM

## 2014-03-09 DIAGNOSIS — Z902 Acquired absence of lung [part of]: Secondary | ICD-10-CM

## 2014-03-09 NOTE — Progress Notes (Signed)
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  SURGICAL PROCEDURE: Video bronchoscopy, right video-assisted  thoracoscopy, right thoracotomy, right upper lobe resection with en bloc  resection of mediastinal involvement and lymph node dissection,  placement of On-Q device.  Lung cancer, right upper lobe   Primary site: Lung (Right)   Staging method: AJCC 7th Edition   Pathologic: Stage IIIA (T4, N0, cM0) signed by Grace Isaac, MD on 09/29/2013 11:30 AM   Summary: Stage IIIA (T4, N0, cM0)   History of Present Illness:     Patient is doing well postoperatively.  He has returned to work full-time. He's not bothered by shortness of breath.  He continues on xarelto for preoperative DVT involving the left leg. The patient did start radiation therapy postoperative however after 10 treatments he refused any more because of fatigue. The patient continues to improve. He notes that the discomfort postoperatively along the right incision has now markedly improved.  He is scheduled to have his cable filter removed early next week, he continues on Xarelto. He still has some left lower leg edema especially when up on his feet for prolonged period of time.    Past Medical History  Diagnosis Date  . 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 GERD (gastroesophageal reflux disease)   . Kidney stones   . Anemia   . Hypertension   . History of blood transfusion   . Left leg DVT     S/P IVC  filter placement 7/10, on Xarelto     History  Smoking status  . Former Smoker -- 1.00 packs/day for 20 years  . Types: Cigarettes  . Quit date: 03/02/2005  Smokeless tobacco  . Never Used    History  Alcohol Use  . Yes    Comment: ocassionally     Allergies  Allergen Reactions  . Emend [Aprepitant] Anaphylaxis  . Paclitaxel Anaphylaxis    Current Outpatient Prescriptions  Medication Sig Dispense Refill  . hydrochlorothiazide (HYDRODIURIL) 25 MG tablet Take 25 mg by mouth daily.    . nebivolol (BYSTOLIC) 5 MG tablet Take 5 mg by mouth daily.    Earney Navy Bicarbonate (ZEGERID PO) Take 1 capsule by mouth daily as needed (acid reflux).    Marland Kitchen oxyCODONE (OXY IR/ROXICODONE) 5 MG immediate release tablet Take 1-2 tablets (5-10 mg total) by mouth every 4 (four) hours as needed for moderate pain or severe pain. 50 tablet 0  . rivaroxaban (XARELTO) 20 MG TABS tablet Take 20 mg by mouth daily with supper.    Marland Kitchen Fexofenadine HCl (ALLEGRA PO) Take 1 tablet by mouth daily as needed (allergies).     No current facility-administered medications for this visit.       Physical Exam: BP 129/88 mmHg  Pulse 56  Resp 20  Ht 5\' 6"  (1.676 m)  Wt 193 lb (87.544 kg)  BMI 31.17 kg/m2  SpO2 97%  General appearance:  alert, cooperative and appears stated age Neurologic: intact Heart: regular rate and rhythm, S1, S2 normal, no murmur, click, rub or gallop Lungs: clear to auscultation bilaterally Abdomen: soft, non-tender; bowel sounds normal; no masses,  no organomegaly Extremities: edema Bilateral lower extremity edema slightly greater on the left than the right Wound: Right chest incisions are well-healed   Diagnostic Studies & Laboratory data:     Recent Radiology Findings:   Dg Chest 2 View  03/09/2014   CLINICAL DATA:  Status post right-sided lung malignancy with video-assisted wedge resection in July of 2015; currently asymptomatic  EXAM: CHEST  2 VIEW  COMPARISON:  [facility]  and lateral chest x-ray dated December 01, 2013  FINDINGS: There is mild stable elevation of the right hemidiaphragm. The lungs are adequately inflated and clear. There is no pleural effusion or alveolar infiltrate. The heart and pulmonary vascularity are normal. There are stable surgical clips in the right paratracheal region and right apex. The Port-A-Cath appliance tip projects over the midportion of the SVC. The bony thorax exhibits no acute abnormality.  IMPRESSION: There is no pulmonary parenchymal mass, pleural effusion, nor other acute cardiopulmonary abnormality.   Electronically Signed   By: Fernado  Martinique   On: 03/09/2014 12:12   CLINICAL DATA: Restaging of lung cancer. Status post chemotherapy. Status post resection in August of 2015.  EXAM: CT CHEST WITHOUT CONTRAST  TECHNIQUE: Multidetector CT imaging of the chest was performed following the standard protocol without IV contrast..  COMPARISON: 12/01/2013. The chest CT 07/25/2013.  FINDINGS: Lungs/Pleura: Surgical changes, likely of at least partial right upper lobectomy.  Probable scarring at the left lung base laterally. Somewhat nodular component on image 45 measures 7 mm and is new.  Mild centrilobular emphysema. No evidence of locally recurrent disease along the surgical margin.  No pleural fluid.  Heart/Mediastinum: No supraclavicular adenopathy. A right-sided Port-A-Cath which terminates at the high right atrium.  Normal heart size, without pericardial effusion. No mediastinal or definite hilar adenopathy, given limitations of unenhanced CT.  Upper Abdomen: Normal imaged portions of the liver, spleen, stomach, pancreas, adrenal glands. Mild renal cortical atrophy, especially on the right. Incompletely imaged IVC filter. Aortic atherosclerosis which is mild but age advanced. Right hemidiaphragm elevation secondary to volume loss in the right hemi thorax.  Bones/Musculoskeletal: Right-sided rib defects which are  presumably postsurgical.  IMPRESSION: 1. Right-sided surgical changes, likely of right upper lobectomy. No evidence of locally recurrent or metastatic disease. 2. Probable scarring at the left lung base. This has a somewhat more nodular component today, and warrants followup attention.   Electronically Signed By: Abigail Miyamoto M.D. On: 01/03/2014 17:23    Recent Lab Findings: Lab Results  Component Value Date   WBC 8.7 09/07/2013   HGB 16.3 09/07/2013   HCT 50.4 09/07/2013   PLT 94* 09/07/2013   GLUCOSE 106* 09/07/2013   ALT 9 09/07/2013   AST 23 09/07/2013   NA 133* 09/07/2013   K 4.5 09/07/2013   CL 95* 09/07/2013   CREATININE 1.54* 09/07/2013   BUN 14 09/07/2013   CO2 23 09/07/2013   INR 1.10 09/01/2013      Assessment / Plan:      Patient doing well following right upper lobectomy with poorly differentiated carcinoma invading mediastinum, but without direct invasion into the superior vena cava innominate vein or phrenic nerve.  CT scan 12/2013 notable for  scarring at the left lung base. This has a somewhat more nodular component today, and warrants followup attention  The patient is followed [facility]/Dr. Jeb Levering for followup treatment. The patient still has his cable filter in place, to be removed next week  Plan see the patient back in May after follow up ct of chest done per medical oncology  Grace Isaac MD      [address] [place] [address] Office [phone]   Beeper [phone]  03/09/2014 12:37 PM

## 2014-03-14 ENCOUNTER — Ambulatory Visit: Payer: Self-pay | Admitting: Vascular Surgery

## 2014-03-14 LAB — BASIC METABOLIC PANEL
Anion Gap: 7 (ref 7–16)
BUN: 31 mg/dL — ABNORMAL HIGH (ref 7–18)
CALCIUM: 8.6 mg/dL (ref 8.5–10.1)
CO2: 28 mmol/L (ref 21–32)
Chloride: 104 mmol/L (ref 98–107)
Creatinine: 2.17 mg/dL — ABNORMAL HIGH (ref 0.60–1.30)
EGFR (Non-African Amer.): 35 — ABNORMAL LOW
GFR CALC AF AMER: 42 — AB
Glucose: 93 mg/dL (ref 65–99)
Osmolality: 284 (ref 275–301)
POTASSIUM: 3.7 mmol/L (ref 3.5–5.1)
SODIUM: 139 mmol/L (ref 136–145)

## 2014-05-02 ENCOUNTER — Ambulatory Visit
Admit: 2014-05-02 | Disposition: A | Discharge status: Home / Self Care | Payer: Self-pay | Attending: Oncology | Admitting: Oncology

## 2014-05-26 ENCOUNTER — Ambulatory Visit
Admit: 2014-05-26 | Disposition: A | Discharge status: Home / Self Care | Payer: Self-pay | Attending: Oncology | Admitting: Oncology

## 2014-06-12 LAB — CBC CANCER CENTER
BASOS PCT: 0.8 %
Basophil #: 0 x10 3/mm (ref 0.0–0.1)
EOS PCT: 1.4 %
Eosinophil #: 0.1 x10 3/mm (ref 0.0–0.7)
HCT: 35.6 % — ABNORMAL LOW (ref 40.0–52.0)
HGB: 12.2 g/dL — AB (ref 13.0–18.0)
LYMPHS PCT: 17.7 %
Lymphocyte #: 1.1 x10 3/mm (ref 1.0–3.6)
MCH: 34.2 pg — ABNORMAL HIGH (ref 26.0–34.0)
MCHC: 34.4 g/dL (ref 32.0–36.0)
MCV: 99 fL (ref 80–100)
MONOS PCT: 7.9 %
Monocyte #: 0.5 x10 3/mm (ref 0.2–1.0)
Neutrophil #: 4.5 x10 3/mm (ref 1.4–6.5)
Neutrophil %: 72.2 %
Platelet: 186 x10 3/mm (ref 150–440)
RBC: 3.58 10*6/uL — AB (ref 4.40–5.90)
RDW: 13.3 % (ref 11.5–14.5)
WBC: 6.3 x10 3/mm (ref 3.8–10.6)

## 2014-06-12 LAB — COMPREHENSIVE METABOLIC PANEL
ALBUMIN: 4.1 g/dL
ANION GAP: 8 (ref 7–16)
Alkaline Phosphatase: 66 U/L
BILIRUBIN TOTAL: 0.8 mg/dL
BUN: 33 mg/dL — ABNORMAL HIGH
CHLORIDE: 100 mmol/L — AB
Calcium, Total: 8.7 mg/dL — ABNORMAL LOW
Co2: 28 mmol/L
Creatinine: 1.82 mg/dL — ABNORMAL HIGH
EGFR (African American): 50 — ABNORMAL LOW
EGFR (Non-African Amer.): 43 — ABNORMAL LOW
Glucose: 150 mg/dL — ABNORMAL HIGH
Potassium: 3.7 mmol/L
SGOT(AST): 22 U/L
SGPT (ALT): 16 U/L — ABNORMAL LOW
SODIUM: 136 mmol/L
TOTAL PROTEIN: 7.1 g/dL

## 2014-06-17 NOTE — Consult Note (Signed)
Reason for Visit: This 49 year old Male patient presents to the clinic for initial evaluation of  lung cancer .   Referred by Dr. Oliva Bustard.  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 Patient had response to chemotherapy although now still deemed unresectable. For consideration of radiation therapy with curative intent  Pathology Report pathology report reviewed   Imaging Report cT scans and PET CT scan is reviewed   Referral Report clinical notes reviewed   Planned Treatment Regimen chemoradiation with curative intent if surgery is declined   HPI   patient is a 49 year old male who presented with cough upper respiratory infection, chest x-ray to have a large right lung lesion. CT scan confirmed a right hilar lesion with extension to the mediastinum. PET CT scan showed hypermetabolic mass in the right upper lobe consistent with malignancy. No evidence of mediastinal metastasis was noted.he was thought our surgical oncology been on resectable and underwent chemotherapy consisting of cis-platinum and gemcitabine after being switched from carboplatin and Taxol secondary to severe reaction. He is tolerating his chemotherapy well. Repeat CT scan demonstrates marked shrinkage of the lung mass although according to our surgical oncologist he is still on resectable. He has a second opinion in [place] a later this week. Patient has various little symptoms at this time no significant cough hemoptysis or chest tightness.  Past Hx:    GERD - Esophageal Reflux:    Kidney Stones:    Anemia:    Lung Cancer:    HTN:    cyst removed off of right side of neck:    inguinal hernia repair:   Past, Family and Social History:  Past Medical History positive   Cardiovascular hypertension   Gastrointestinal GERD   Genitourinary  kidney stones   Past Surgical History inguinal hernia repair, benign cyst removed from right side of neck   Past Medical History Comments anemia   Family History noncontributory   Social History positive   Social History Comments 40 pack year smoking history quit smoking in 2007 no EtOH abuse history   Additional Past Medical and Surgical History seen accompanied by his wife today   Allergies:   Emend: Anaphylaxis  Paclitaxel: Anaphylaxis  Home Meds:  Home Medications: Medication Instructions Status  furosemide 20 mg oral tablet 1 tab(s) orally once a day Active  doxycycline hyclate 100 mg oral delayed release tablet 1 tab(s) orally 2 times a day Active  meloxicam 15 mg oral tablet 1 tab(s) orally once a day Active  Norco 5 mg-325 mg oral tablet 1 tab(s) orally every 6 hours, As Needed Active  ondansetron 4 mg oral tablet 1 tab(s) orally every 6 hours, As Needed for chemotherapy induced nausea and vomiting  Active  dexamethasone 4 mg oral tablet 2 tab(s) orally once a day take night prior to chemo  Active  Bystolic 5 mg oral tablet 1 tab(s) orally once a day (in the morning) Active  Tylenol Extra Strength 500 mg oral tablet 2 tab(s) orally every 6 hours, As Needed Active  Allegra 180 mg oral tablet 1 tab(s) orally once a day Active  hydrochlorothiazide 25 mg oral tablet 1 tab(s) orally once a day Active  Zegerid OTC 20 mg-1100 mg oral capsule 1  orally every other day, As Needed Active   Review of Systems:  General negative   Performance Status (ECOG) 0  Skin negative   Breast negative   Ophthalmologic negative   ENMT negative   Respiratory and Thorax see HPI   Cardiovascular negative   Gastrointestinal negative   Genitourinary negative   Musculoskeletal negative   Neurological negative   Psychiatric negative   Hematology/Lymphatics negative   Endocrine negative   Allergic/Immunologic negative   Review of Systems   review of systems obtained from  nurses notes  Nursing Notes:  Nursing Vital Signs and Chemo Nursing Nursing Notes: *CC Vital Signs Flowsheet:   06-May-15 13:45  Temp Temperature 97.2  Pulse Pulse 71  Respirations Respirations 20  SBP SBP 129  DBP DBP 92  Pain Scale (0-10)  0  Current Weight (kg) (kg) 84.9  Height (cm) centimeters 168  BSA (m2) 1.9   Physical Exam:  General/Skin/HEENT:  General normal   Skin normal   Eyes normal   ENMT normal   Head and Neck normal   Additional PE well-developed well-nourished male in in NAD. Lungs are clear to A&P cardiac examination shows regular rate and rhythm no cervical or supraclavicular adenopathy is identified. Abdomen is benign.   Breasts/Resp/CV/GI/GU:  Respiratory and Thorax normal   Cardiovascular normal   Gastrointestinal normal   Genitourinary normal   MS/Neuro/Psych/Lymph:  Musculoskeletal normal   Neurological normal   Lymphatics normal   Other Results:  Radiology Results: LabUnknown:    07-Jan-15 15:45, CT Chest With Contrast  PACS Image     23-Jan-15 12:33, PET/CT Scan Lung Cancer Initial Staging  PACS Image   CT:    07-Jan-15 15:45, CT Chest With Contrast  CT Chest With Contrast   REASON FOR EXAM:    Evaluate 11 x 7 cm mass seen on chest xray 02/10/13    [facility]  COMMENTS:       PROCEDURE: KCT - KCT CHEST WITH CONTRAST  - Mar 02 2013  3:45PM     CLINICAL DATA:  Cough and congestion.    EXAM:  CT CHEST WITH CONTRAST    TECHNIQUE:  Multidetector CT imaging of the chest was performed during  intravenous contrast administration.  CONTRAST:  75 cc Isovue 370.    COMPARISON:  None.    FINDINGS:  There is no axillary lymphadenopathy. Upper normal paratracheal  lymph node is identified, but no associated mediastinal  lymphadenopathy. No left hilar lymphadenopathy.    7.6 x 8.6 x 9.1 cm solid masses identified in the medial right upper  hemi thorax, extending into and filling the right apex. Central  vascularity is identified  withinthe lesion that abuts the right  mediastinum he generating mass-effect on the superior vena cava.  There is mild caudal displacement of the right mainstem bronchus by  the mass, but no definite invasion into the bronchial lumen is  evident. There is no evidence for chest wall invasion. The overlying  ribs show no erosion or sclerosis    The heart size is normal. There is no pericardial or pleural  effusion.    Lung windows show some subsegmental atelectasis in the anterior  right upper lobe, anterior to the mass lesion. There is some  compressive atelectasis along the periphery of the lesion. Focal  bleb is seen in the medial left lower lobe.    Bone windows reveal no worrisome lytic or sclerotic osseous lesions.     IMPRESSION:  9 cm mass inthe medial right upper hemi thorax is well-defined and  demonstrates adjacent compressive atelectasis. No definite  associated metastatic lymphadenopathy in the mediastinum or  hilum.  Primary bronchogenic neoplasm would be a consideration. Solitary  benign fibrous tumor of the pleura could also have this appearance.      Electronically Signed    By: Misty Stanley M.D.    On: 03/02/2013 16:09         Verified By: ERIC A. MANSELL, M.D.,  Nuclear Med:    23-Jan-15 12:33, PET/CT Scan Lung Cancer Initial Staging  PET/CT Scan Lung Cancer Initial Staging   REASON FOR EXAM:    Positive biopsy of R lung mass  COMMENTS:       PROCEDURE: PET - PET/CT INIT STAGING LUNG CA  - Mar 18 2013 12:33PM     CLINICAL DATA:  Initial treatment strategy for right lung mass.    EXAM:  NUCLEAR MEDICINE PET SKULL BASE TO THIGH    FASTING BLOOD GLUCOSE:  Value: '111mg'$ /dl    TECHNIQUE:  11.4 mCi F-18 FDG was injected intravenously. CT data was obtained  and used for attenuation correction and anatomic localization only.  (This was not acquired as a diagnostic CT examination.) Additional  exam technical data entered on technologist  worksheet.    COMPARISON:  CT ASPIRATION dated 03/14/2013    FINDINGS:  NECK    There is misregistration within the head and neck due to patient  head motion. There is metabolic activity within the oropharynx which  felt to be physiologic. Hypermetabolic skin thickening over the soft  tissues anterior to the parasymphyseal mandible on the right.. No  hypermetabolic lymph nodes.    CHEST  Within the right upper lobe there is a large round hypermetabolic  mass abutting the medial mediastinum with SUV max 18.2 there are no  hypermetabolic mediastinal lymph nodes.    ABDOMEN/PELVIS    No abnormal hypermetabolic activity within the liver, pancreas,  adrenal glands, or spleen. No hypermetabolic lymph nodes in the  abdomen or pelvis.    SKELETON    No focal hypermetabolic activity to suggest skeletal metastasis.     IMPRESSION:  1. Large hypermetabolic rounded mass in the right upper lobe is  consistent malignancy.  2. No evidence of mediastinal metastasis.  3. No evidence of distant metastasis.  4. Hypermetabolic skin thickening anterior to the central mandible  on the right of unclear etiology. Hypermetabolic activity in the  posterior oropharynx is likely physiologic      Electronically Signed    By: Suzy Bouchard M.D.    On: 03/18/2013 13:58         Verified By: Rennis Golden, M.D.,   Relevent Results:   Relevant Scans and Labs CT scan PET CT scans reviewed   Assessment and Plan: Impression:   stage IIIa poorly differentiated carcinoma the right lung status post induction chemotherapy with moderate response in 49 year old male Plan:   at this time I have recommended going to with chemotherapy radiation. Patient has a second opinion with search will oncology later this week and if they declined surgical exploration would start treatment planning. We'll plan on delivering 6000 cGy. Would use IM RT treatment planning and delivery based on the fact this is a large  tumor MIG 22 along with the over normal lung tolerance. Also would use IM RT to avoid his esophagus and spinal cord both in close proximity to the tumor. Risks and benefits of treatment including increased cough possible dysphasia, alteration of blood counts, fatigue or discussed in detail with the patient. He seems to comprehend my treatment plan well. I have tentatively given  him a CT simulation appointment for next week. Should change of plans occur and surgery is offered will review his pathology and make further recommendations at that time.  I would like to take this opportunity for allowing me to participate in the care of your patient..  CC Referral:  cc: Dr. Netty Starring   Electronic Signatures: Armstead Peaks (MD)  (Signed 07-May-15 11:08)  Authored: HPI, Diagnosis, Past Hx, PFSH, Allergies, Home Meds, ROS, Nursing Notes, Physical Exam, Other Results, Relevent Results, Encounter Assessment and Plan, CC Referring Physician   Last Updated: 07-May-15 11:08 by Armstead Peaks (MD)

## 2014-06-17 NOTE — Op Note (Signed)
PATIENT NAME:  Karl Wise, Karl Wise MR#:  [id number] DATE OF BIRTH:  02/12/1966  DATE OF PROCEDURE:  04/06/2013  SURGEON: Louis Matte, M.D.   ASSISTANT: None.   PREOPERATIVE DIAGNOSIS: Right upper lobe malignancy.  POSTOPERATIVE DIAGNOSIS: Right upper lobe malignancy.   OPERATION PERFORMED: Right internal jugular ultrasound-guided Port-A-Cath.   INDICATIONS FOR PROCEDURE: Mr. Bones is a 49 year old gentleman with a very large right upper lobe mass, which on biopsy was proven to be a malignancy. After extensive evaluation, he was offered a Port-A-Cath insertion to receive preoperative chemotherapy.   DATE OF PROCEDURE: He was brought to the operating room today after giving informed consent. Dr. Patricia Pesa  performed an endobronchial ultrasound with biopsy through a laryngeal mask airway prior to my Port-A-Cath insertion. This will be dictated under separate cover.   With the patient in the supine position, already anesthetized with the laryngeal mask airway, the patient was prepped and draped in the usual sterile fashion. An ultrasound was then used to locate the right internal jugular vein. It was percutaneously catheterized. A wire was introduced through the needle and positioned in the right side of the heart. This was all done under fluoroscopic guidance.   We then made a skin incision over the anterior chest wall and created a pocket for our Port-A-Cath. The pocket was hemostatic. We then passed the catheter from our pocket up to the neck. It was then inserted through a peel-away sheath and positioned at the right atrial superior vena cava junction. The catheter was then trimmed and the Port-A-Cath assembled. It was flushed easily.   Under fluoroscopic guidance, the catheter was found to be in good position. The catheter was then secured to the anterior chest wall with 4 Prolene sutures of 2-0. The catheter was again checked on fluoroscopy and found to be in good position. The wounds were  then closed with 2-0 Vicryl on the subcutaneous tissue and 4-0 nylon on the skin. The neck incision was closed with a single nylon suture. The patient tolerated the procedure well and was taken to the recovery room after extubation in stable condition.   ____________________________ Lew Dawes Genevive Bi, MD teo:np D: 04/06/2013 16:37:49 ET T: 04/06/2013 16:59:01 ET JOB#: [id number]  cc: Christia Reading E. Genevive Bi, MD, <Dictator> Louis Matte MD ELECTRONICALLY SIGNED 04/24/2013 19:41

## 2014-06-17 NOTE — Op Note (Signed)
PATIENT NAME:  Karl Wise, Karl Wise MR#:  [id number] DATE OF BIRTH:  [birth year]  DATE OF PROCEDURE:  09/02/2013  PREOPERATIVE DIAGNOSES: 1. Deep venous thrombosis. 2. Pulmonary embolism. 3. Lung cancer.  POSTOPERATIVE DIAGNOSES: 1. Deep venous thrombosis. 2. Pulmonary embolism. 3. Lung cancer.  PROCEDURE PERFORMED: Inferior vena cava filter placement.  PROCEDURE PERFORMED BY: Dr. Ronalee Belts.  SEDATION: Versed 2 mg plus fentanyl 50 mcg administered IV. Continuous ECG, pulse oximetry, and cardiopulmonary monitoring was performed throughout the entire procedure by the interventional radiology nurse. Total sedation time was 30 minutes.   ACCESS: A 9 French sheath, right common femoral vein.   FLUOROSCOPY TIME: 0.5 minutes.   CONTRAST USED: 15 mL.   INDICATIONS: [name] is a 49 year old gentleman with multiple medical problems, who will require cessation of his anticoagulation for several procedures and therefore is undergoing placement of an IVC filter to prevent lethal pulmonary embolization. Risks and benefits are reviewed. The patient agrees to proceed.   DESCRIPTION OF PROCEDURE: The patient is taken to special procedures and placed in the supine position. After adequate sedation is achieved, both groins are prepped and draped in sterile fashion. Appropriate timeout is called.   Ultrasound is placed in a sterile sleeve. Ultrasound is utilized secondary to lack of appropriate landmarks and to avoid vascular injury. Common femoral vein is identified. It is echolucent and compressible indicating patency. Image is recorded for the permanent record. One percent lidocaine is infiltrated and under real-time visualization, Seldinger needle is inserted. J-wire is advanced, followed by the dilator and delivery sheath. System is positioned at the iliac confluence, and an AP projection of the IVC is obtained. IVC measures 22 mm in diameter. Renal blushes are at the L1 level and therefore the wire is  reintroduced. The sheath is advanced up to L2, and the filter is then deployed at the L2 level. There are no immediate complications.   INTERPRETATION: Inferior vena cava was opacified with a bolus injection of contrast. There are no filling defects. The Cava size is adequate for filter placement. Filter is positioned at the L2 level with good orientation.    ____________________________ Katha Cabal, MD ggs:lt D: 09/03/2013 12:44:49 ET T: 09/03/2013 21:08:21 ET JOB#: [id number]  cc: Katha Cabal, MD, <Dictator> Katha Cabal MD ELECTRONICALLY SIGNED 09/20/2013 12:21

## 2014-06-25 NOTE — Op Note (Signed)
PATIENT NAME:  Karl Wise, Karl Wise MR#:  [id number] DATE OF BIRTH:  1965/09/02  DATE OF PROCEDURE:  03/14/2014  PREOPERATIVE DIAGNOSES: 1.  Deep vein thrombosis with pulmonary embolism.  2.  Lung carcinoma.   POSTOPERATIVE DIAGNOSES:   1.  Deep vein thrombosis with pulmonary embolism.  2.  Lung carcinoma.  3.  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   PROCEDURE PERFORMED: 1.  Inferior venacavogram.  2.  Removal of infrarenal inferior vena caval filter.  3.  Percutaneous transluminal angioplasty to 14 mm in diameter inferior vena cava.   PROCEDURE PERFORMED BY:  Katha Cabal, M.D.   SEDATION:  Versed 4 mg plus fentanyl 150 mcg administered IV.  Continuous ECG, pulse oximetry and cardiopulmonary monitoring was performed throughout the entire procedure by the interventional radiology nurse. Total sedation time was one hour.   ACCESS:  A 12 French sheath, right IJ.   FLUOROSCOPY TIME: 9.2 minutes.   CONTRAST USED:  Isovue 60 mL.   INDICATIONS: Mr. Senteno is a 49 year old gentleman who has completed his cancer treatment. He is now approximately 6 or 7 months status post insertion of his filter. He has been continuing on his Xarelto without difficulty and he is therefore undergoing removal of his filter and he will complete a year of anticoagulation given his pulmonary embolism. Risks and benefits of a filter removal were reviewed.  All questions answered. The patient agrees to proceed.   PROCEDURE: The patient is taken to special procedures and placed in the supine position. After adequate sedation is achieved, his right neck is prepped and draped in sterile fashion. Ultrasound is placed in a sterile sleeve. Ultrasound is utilized secondary to lack of appropriate landmarks and to avoid vascular injury. Under real-time visualization, the jugular vein is identified. It is echolucent and compressible indicating patency. Image is recorded for the  permanent record. After 1% lidocaine is infiltrated micro needle was inserted, micro wire is advanced. The microwire doubles back and will not advance down into the chest. Therefore, the sheath is inserted over the wire after enough purchase has been given and a small hand injection of contrast is used to demonstrate the jugular vein is patent. It does have several angulations to it and a floppy Glidewire is then used to easily negotiate these down into the inferior vena cava. A 6 French sheath is inserted. Using the floppy Glidewire and a Kumpe catheter the wire is negotiated down into the iliac vein. Wire is then exchanged for the J-wire which is a little more stiff and the dilators are passed over the wire and then the retrieval sheath is advanced and positioned so that the tip is inferior to the filter. Hand injection of contrast demonstrates the filter is in good position. There is no identifiable thrombus within it; however, at the upper end of the filter where the shoulders are located there appears to be an occlusion. There are extensive collaterals noted. The visualized portions of the vena cava appear widely patent.   Tuohy Eino Farber is then advanced over the wire and the sheath is pulled back to just above the filter demonstrating that from the level of the hook upward the inferior vena cava is widely patent and free of thrombus. Because this appears to be a stricture stenosis associated with the filter, a filter retrieval will continue and then subsequently the wire will be reintroduced and angioplasty will be performed. Three thousand units of heparin is given.  The snare is then advanced through the retrieval sheath and after several attempts the hook is secured. The filter is collapsed and removed leaving the large blue retrieval sheath in position. Amplatz Super Stiff wire is then advanced down through the retrieval sheath then into the iliac veins. The sheath is then advanced over the wire and in  magnified imaging a hand injection of contrast is used to demonstrate the stenosis. Initially, a 10 x 4 and subsequently a 14 x 4 [place] and then Atlas balloon respectively are utilized. Inflations are to 8 to 12 atmospheres for approximately 1-1/2 to 2 minutes. Follow-up imaging demonstrates there is now flow through the cava however, the large and extensive collateral still appear to take the majority of the  contrast-enhanced blood.   I do not feel that a stent would be warranted at this time and a 14 blue is the largest that we have which is an appropriate balloon for this area and therefore no further interventions are performed. The cava patency has been maintained.   INTERPRETATION: Initial views of the inferior vena cava are described above, there appears to be a stenosis associated with the filter. There is no evidence of thrombus noted. Filter is removed without difficulty and subsequently angioplasty is performed which does provide a patent lumen for the inferior vena cava.    ____________________________ Katha Cabal, MD ggs:at D: 03/14/2014 12:14:00 ET T: 03/14/2014 12:24:22 ET JOB#: [id number]  cc: Katha Cabal, MD, <Dictator> Dion Body, MD  Katha Cabal MD ELECTRONICALLY SIGNED 03/29/2014 17:42

## 2014-07-11 ENCOUNTER — Ambulatory Visit: Payer: 59 | Admitting: Cardiothoracic Surgery

## 2014-07-11 ENCOUNTER — Other Ambulatory Visit: Payer: Self-pay | Admitting: *Deleted

## 2014-07-11 DIAGNOSIS — G8918 Other acute postprocedural pain: Secondary | ICD-10-CM

## 2014-07-11 MED ORDER — OXYCODONE HCL 5 MG PO TABS: 5.0000 mg | ORAL_TABLET | Freq: Four times a day (QID) | ORAL | Status: DC | PRN

## 2014-07-11 NOTE — Telephone Encounter (Signed)
Left message on voice mail that rx is ready to pick up

## 2014-08-09 ENCOUNTER — Telehealth: Payer: Self-pay | Admitting: *Deleted

## 2014-08-09 DIAGNOSIS — G8918 Other acute postprocedural pain: Secondary | ICD-10-CM

## 2014-08-09 MED ORDER — OXYCODONE HCL 5 MG PO TABS: 5.0000 mg | ORAL_TABLET | Freq: Four times a day (QID) | ORAL | Status: DC | PRN

## 2014-08-09 NOTE — Telephone Encounter (Signed)
Informed that prescription is ready to pick up  

## 2014-08-19 ENCOUNTER — Ambulatory Visit
Admission: EM | Admit: 2014-08-19 | Discharge: 2014-08-19 | Disposition: A | Discharge status: Home / Self Care | Payer: 59 | Attending: Family Medicine | Admitting: Family Medicine

## 2014-08-19 ENCOUNTER — Encounter: Payer: Self-pay | Admitting: Gynecology

## 2014-08-19 DIAGNOSIS — J0100 Acute maxillary sinusitis, unspecified: Secondary | ICD-10-CM

## 2014-08-19 MED ORDER — AMOXICILLIN 875 MG PO TABS: 875.0000 mg | ORAL_TABLET | Freq: Two times a day (BID) | ORAL | Status: DC

## 2014-08-19 NOTE — ED Notes (Signed)
Patient c/o sinus infection. Pt. Stated sinus drainage /facial pressure x couple weeks

## 2014-08-19 NOTE — ED Provider Notes (Signed)
CSN: [id number]     Arrival date & time 08/19/14  1328 History   First MD Initiated Contact with Patient 08/19/14 1346     Chief Complaint  Patient presents with  . Facial Pain   (Consider location/radiation/quality/duration/timing/severity/associated sxs/prior Treatment) Patient is a 49 y.o. male presenting with URI.  URI Presenting symptoms: congestion and facial pain   Severity:  Moderate Onset quality:  Sudden Duration:  2 weeks Timing:  Constant Progression:  Unchanged Chronicity:  New Relieved by:  Nothing Associated symptoms: sinus pain     Past Medical History  Diagnosis Date  . Lung cancer     T3,N0,M0, CT-guided needle BX's positive for poorly differentiated carcinoma  . GERD (gastroesophageal reflux disease)   . Kidney stones   . Anemia   . Hypertension   . History of blood transfusion   . Left leg DVT     S/P IVC filter placement 7/10, on Xarelto   Past Surgical History  Procedure Laterality Date  . Cyst removed off of right side of neck    . Inguinal hernia repair    . Port-a-cath placement    . Video bronchoscopy N/A 09/05/2013    Procedure: VIDEO BRONCHOSCOPY;  Surgeon: Grace Isaac, MD;  Location: [facility] OR;  Service: Thoracic;  Laterality: N/A;  . Video assisted thoracoscopy (vats)/wedge resection Right 09/05/2013    Procedure: VIDEO ASSISTED THORACOSCOPY (VATS); Right Thoracotomy; Right Upper Lobectomy with Node Dissection and Enblock Resection of Mediastinal Invasion and Placement of OnQ Pain Pump;  Surgeon: Grace Isaac, MD;  Location: [place];  Service: Thoracic;  Laterality: Right;   No family history on file. History  Substance Use Topics  . Smoking status: Former Smoker -- 1.00 packs/day for 20 years    Types: Cigarettes    Quit date: 03/02/2005  . Smokeless tobacco: Never Used  . Alcohol Use: Yes     Comment: ocassionally    Review of Systems  HENT: Positive for congestion.     Allergies  Emend and Paclitaxel  Home Medications    Prior to Admission medications   Medication Sig Start Date End Date Taking? Authorizing Provider  Fexofenadine HCl (ALLEGRA PO) Take 1 tablet by mouth daily as needed (allergies).   Yes Historical Provider, MD  hydrochlorothiazide (HYDRODIURIL) 25 MG tablet Take 25 mg by mouth daily.   Yes Historical Provider, MD  nebivolol (BYSTOLIC) 5 MG tablet Take 5 mg by mouth daily.   Yes Historical Provider, MD  Omeprazole-Sodium Bicarbonate (ZEGERID PO) Take 1 capsule by mouth daily as needed (acid reflux).   Yes Historical Provider, MD  oxyCODONE (OXY IR/ROXICODONE) 5 MG immediate release tablet Take 1-2 tablets (5-10 mg total) by mouth every 6 (six) hours as needed for moderate pain or severe pain. 08/09/14  Yes Evlyn Kanner, NP  rivaroxaban (XARELTO) 20 MG TABS tablet Take 20 mg by mouth daily with supper.   Yes Historical Provider, MD  amoxicillin (AMOXIL) 875 MG tablet Take 1 tablet (875 mg total) by mouth 2 (two) times daily. 08/19/14   Norval Gable, MD   BP 145/84 mmHg  Pulse 73  Temp(Src) 98.3 F (36.8 C) (Oral)  Ht '5\' 7"'$  (1.702 m)  Wt 190 lb (86.183 kg)  BMI 29.75 kg/m2  SpO2 100% Physical Exam  Constitutional: He appears well-developed and well-nourished. No distress.  HENT:  Head: Normocephalic and atraumatic.  Right Ear: Tympanic membrane, external ear and ear canal normal.  Left Ear: Tympanic membrane, external ear and ear canal  normal.  Nose: Right sinus exhibits maxillary sinus tenderness. Right sinus exhibits no frontal sinus tenderness. Left sinus exhibits maxillary sinus tenderness. Left sinus exhibits no frontal sinus tenderness.  Mouth/Throat: Uvula is midline, oropharynx is clear and moist and mucous membranes are normal. No oropharyngeal exudate or tonsillar abscesses.  Eyes: Conjunctivae and EOM are normal. Pupils are equal, round, and reactive to light. Right eye exhibits no discharge. Left eye exhibits no discharge. No scleral icterus.  Neck: Normal range of motion.  Neck supple. No tracheal deviation present. No thyromegaly present.  Cardiovascular: Normal rate, regular rhythm and normal heart sounds.   Pulmonary/Chest: Effort normal and breath sounds normal. No stridor. No respiratory distress. He has no wheezes. He has no rales. He exhibits no tenderness.  Lymphadenopathy:    He has no cervical adenopathy.  Neurological: He is alert.  Skin: Skin is warm and dry. No rash noted. He is not diaphoretic.  Nursing note and vitals reviewed.   ED Course  Procedures (including critical care time) Labs Review Labs Reviewed - No data to display  Imaging Review No results found.   MDM   1. Acute maxillary sinusitis, recurrence not specified    Discharge Medication List as of 08/19/2014  1:59 PM    START taking these medications   Details  amoxicillin (AMOXIL) 875 MG tablet Take 1 tablet (875 mg total) by mouth 2 (two) times daily., Starting 08/19/2014, Until Discontinued, Normal      Plan: 1.  diagnosis reviewed with patient 2. rx as per orders; risks, benefits, potential side effects reviewed with patient 3. Recommend supportive treatment with otc analgesics prn 4. F/u prn if symptoms worsen or don't improve    Norval Gable, MD 08/19/14 1401

## 2014-08-24 ENCOUNTER — Encounter: Payer: Self-pay | Admitting: Cardiothoracic Surgery

## 2014-08-24 ENCOUNTER — Ambulatory Visit (INDEPENDENT_AMBULATORY_CARE_PROVIDER_SITE_OTHER): Payer: 59 | Admitting: Cardiothoracic Surgery

## 2014-08-24 VITALS — BP 148/92 | HR 62 | Resp 16 | Ht 66.0 in | Wt 194.0 lb

## 2014-08-24 DIAGNOSIS — Z9889 Other specified postprocedural states: Secondary | ICD-10-CM

## 2014-08-24 DIAGNOSIS — C3411 Malignant neoplasm of upper lobe, right bronchus or lung: Secondary | ICD-10-CM

## 2014-08-24 DIAGNOSIS — Z902 Acquired absence of lung [part of]: Secondary | ICD-10-CM

## 2014-08-25 NOTE — Progress Notes (Signed)
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  SURGICAL PROCEDURE: Video bronchoscopy, right video-assisted  thoracoscopy, right thoracotomy, right upper lobe resection with en bloc  resection of mediastinal involvement and lymph node dissection,  placement of On-Q device.  Lung cancer, right upper lobe   Primary site: Lung (Right)   Staging method: AJCC 7th Edition   Pathologic: Stage IIIA (T4, N0, cM0) signed by Grace Isaac, MD on 09/29/2013 11:30 AM   Summary: Stage IIIA (T4, N0, cM0)   History of Present Illness:     Patient is doing well postoperatively.  He  returned to work full-time now has changed jobs. He's not bothered by shortness of breath.  He continues on xarelto for preoperative DVT involving the left leg.  Patient continues to improve now back playing golf.   He still has some left lower leg edema especially when up on his feet for prolonged period of time. Caval filter removed, he wants to get porta cath out.   Past Medical History  Diagnosis Date  . 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 GERD (gastroesophageal reflux disease)   . Kidney stones   . Anemia   . Hypertension   . History of blood transfusion   . Left leg DVT     S/P IVC filter placement 7/10, on Xarelto     History  Smoking status  . Former Smoker -- 1.00 packs/day for 20 years  . Types: Cigarettes  . Quit date: 03/02/2005  Smokeless tobacco  . Never Used    History    Alcohol Use  . Yes    Comment: ocassionally     Allergies  Allergen Reactions  . Emend [Aprepitant] Anaphylaxis  . Paclitaxel Anaphylaxis    Current Outpatient Prescriptions  Medication Sig Dispense Refill  . amoxicillin (AMOXIL) 875 MG tablet Take 1 tablet (875 mg total) by mouth 2 (two) times daily. 20 tablet 0  . Fexofenadine HCl (ALLEGRA PO) Take 1 tablet by mouth daily as needed (allergies).    . hydrochlorothiazide (HYDRODIURIL) 25 MG tablet Take 25 mg by mouth daily.    . nebivolol (BYSTOLIC) 5 MG tablet Take 5 mg by mouth daily.    Earney Navy Bicarbonate (ZEGERID PO) Take 1 capsule by mouth daily as needed (acid reflux).    Marland Kitchen oxyCODONE (OXY IR/ROXICODONE) 5 MG immediate release tablet Take 1-2 tablets (5-10 mg total) by mouth every 6 (six) hours as needed for moderate pain or severe pain. 60 tablet 0  . rivaroxaban (XARELTO) 20 MG TABS tablet Take 20 mg by mouth daily with supper.     No current facility-administered medications for this visit.       Physical Exam: BP 148/92 mmHg  Pulse 62  Resp 16  Ht '5\' 6"'$  (1.676 m)  Wt 194 lb (87.998 kg)  BMI 31.33 kg/m2  SpO2 98%  General appearance: alert, cooperative and appears stated  age Neurologic: intact Heart: regular rate and rhythm, S1, S2 normal, no murmur, click, rub or gallop Lungs: clear to auscultation bilaterally Abdomen: soft, non-tender; bowel sounds normal; no masses,  no organomegaly Extremities: edema Bilateral lower extremity edema slightly greater on the left than the right Wound: Right chest incisions are well-healed   Diagnostic Studies & Laboratory data:     Recent Radiology Findings:  CLINICAL DATA: Subsequent treatment strategy for lung cancer. Right upper lobe cancer with chemo radiation therapy complete.  EXAM: CT CHEST WITHOUT CONTRAST  TECHNIQUE: Multidetector CT imaging of the chest was performed following the standard protocol without IV contrast..  COMPARISON: CT  thorax 01/04/2007  FINDINGS: Mediastinum/Nodes: No axillary or supraclavicular lymphadenopathy. Port in the right anterior chest wall. No mediastinal hilar lymphadenopathy. No pericardial.  Lungs/Pleura: There is postoperative change in the right upper lobe. No nodularity are present. Left lung is clear. Thin-walled benign cyst in medial left upper lobe.  Upper abdomen: Limited view of the liver, kidneys, pancreas are unremarkable. Normal adrenal glands. Gallstone noted.  Musculoskeletal: No aggressive osseous lesion.  IMPRESSION: 1. Stable postop change in the right upper lobe. 2. No evidence local lung cancer recurrence or mediastinal nodal metastasis   Electronically Signed By: Suzy Bouchard M.D. On: 06/08/2014 17:22    CLINICAL DATA: Restaging of lung cancer. Status post chemotherapy. Status post resection in August of 2015.  EXAM: CT CHEST WITHOUT CONTRAST  TECHNIQUE: Multidetector CT imaging of the chest was performed following the standard protocol without IV contrast..  COMPARISON: 12/01/2013. The chest CT 07/25/2013.  FINDINGS: Lungs/Pleura: Surgical changes, likely of at least partial right upper lobectomy.  Probable scarring at the left lung base laterally. Somewhat nodular component on image 45 measures 7 mm and is new.  Mild centrilobular emphysema. No evidence of locally recurrent disease along the surgical margin.  No pleural fluid.  Heart/Mediastinum: No supraclavicular adenopathy. A right-sided Port-A-Cath which terminates at the high right atrium.  Normal heart size, without pericardial effusion. No mediastinal or definite hilar adenopathy, given limitations of unenhanced CT.  Upper Abdomen: Normal imaged portions of the liver, spleen, stomach, pancreas, adrenal glands. Mild renal cortical atrophy, especially on the right. Incompletely imaged IVC filter. Aortic atherosclerosis which is mild but age advanced. Right hemidiaphragm  elevation secondary to volume loss in the right hemi thorax.  Bones/Musculoskeletal: Right-sided rib defects which are presumably postsurgical.  IMPRESSION: 1. Right-sided surgical changes, likely of right upper lobectomy. No evidence of locally recurrent or metastatic disease. 2. Probable scarring at the left lung base. This has a somewhat more nodular component today, and warrants followup attention.   Electronically Signed By: Abigail Miyamoto M.D. On: 01/03/2014 17:23    Recent Lab Findings: Lab Results  Component Value Date   WBC 6.3 06/12/2014   HGB 12.2* 06/12/2014   HCT 35.6* 06/12/2014   PLT 186 06/12/2014   GLUCOSE 150* 06/12/2014   ALT 16* 06/12/2014   AST 22 06/12/2014   NA 136 06/12/2014   K 3.7 06/12/2014   CL 100* 06/12/2014   CREATININE 1.82* 06/12/2014   BUN 33* 06/12/2014   CO2 28 06/12/2014   INR 1.10 09/01/2013      Assessment / Plan:      Patient doing well following right upper lobectomy with poorly differentiated carcinoma invading mediastinum, but without direct invasion into the superior vena cava innominate vein or phrenic nerve. Recent CT scan of the chest shows no evidence of recurrent disease.  The patient is followed [facility]/Dr.  Choski for followup treatment.  Plan see the patient back in 6 months   Grace Isaac MD      [address] [place] [place], [address] Office [phone]   Beeper [phone]  08/25/2014 4:21 PM

## 2014-09-04 ENCOUNTER — Telehealth: Payer: Self-pay | Admitting: *Deleted

## 2014-09-04 NOTE — Telephone Encounter (Signed)
  Oncology Nurse Navigator Documentation    Navigator Encounter Type: Telephone (09/04/14 1000)    patient inquiring about stopping xarelto as it has been 1 year that he has been on it. After discussion with Dr. Oliva Bustard, notified patient that he likely can stop xarelto but will need to discuss further with Dr. Oliva Bustard at next follow up appointment next week.

## 2014-09-05 ENCOUNTER — Telehealth: Payer: Self-pay | Admitting: *Deleted

## 2014-09-05 DIAGNOSIS — G8918 Other acute postprocedural pain: Secondary | ICD-10-CM

## 2014-09-05 MED ORDER — OXYCODONE HCL 5 MG PO TABS: 5.0000 mg | ORAL_TABLET | Freq: Four times a day (QID) | ORAL | Status: DC | PRN

## 2014-09-05 NOTE — Telephone Encounter (Signed)
Informed that prescription is ready to pick up  

## 2014-09-08 ENCOUNTER — Other Ambulatory Visit: Payer: Self-pay | Admitting: *Deleted

## 2014-09-08 DIAGNOSIS — C3410 Malignant neoplasm of upper lobe, unspecified bronchus or lung: Secondary | ICD-10-CM

## 2014-09-11 ENCOUNTER — Inpatient Hospital Stay: Payer: 59 | Attending: Oncology | Admitting: Oncology

## 2014-09-11 ENCOUNTER — Inpatient Hospital Stay: Payer: 59

## 2014-09-11 VITALS — BP 146/94 | HR 55 | Temp 95.8°F | Wt 190.0 lb

## 2014-09-11 DIAGNOSIS — D649 Anemia, unspecified: Secondary | ICD-10-CM | POA: Diagnosis not present

## 2014-09-11 DIAGNOSIS — Z87442 Personal history of urinary calculi: Secondary | ICD-10-CM | POA: Insufficient documentation

## 2014-09-11 DIAGNOSIS — Z86718 Personal history of other venous thrombosis and embolism: Secondary | ICD-10-CM | POA: Diagnosis not present

## 2014-09-11 DIAGNOSIS — Z85118 Personal history of other malignant neoplasm of bronchus and lung: Secondary | ICD-10-CM | POA: Diagnosis present

## 2014-09-11 DIAGNOSIS — Z9221 Personal history of antineoplastic chemotherapy: Secondary | ICD-10-CM | POA: Diagnosis not present

## 2014-09-11 DIAGNOSIS — K219 Gastro-esophageal reflux disease without esophagitis: Secondary | ICD-10-CM | POA: Insufficient documentation

## 2014-09-11 DIAGNOSIS — Z87891 Personal history of nicotine dependence: Secondary | ICD-10-CM | POA: Diagnosis not present

## 2014-09-11 DIAGNOSIS — C3410 Malignant neoplasm of upper lobe, unspecified bronchus or lung: Secondary | ICD-10-CM

## 2014-09-11 DIAGNOSIS — I10 Essential (primary) hypertension: Secondary | ICD-10-CM | POA: Diagnosis not present

## 2014-09-11 DIAGNOSIS — Z923 Personal history of irradiation: Secondary | ICD-10-CM | POA: Insufficient documentation

## 2014-09-11 DIAGNOSIS — Z79899 Other long term (current) drug therapy: Secondary | ICD-10-CM | POA: Diagnosis not present

## 2014-09-11 LAB — COMPREHENSIVE METABOLIC PANEL
ALK PHOS: 60 U/L (ref 38–126)
ALT: 16 U/L — ABNORMAL LOW (ref 17–63)
ANION GAP: 7 (ref 5–15)
AST: 21 U/L (ref 15–41)
Albumin: 4.3 g/dL (ref 3.5–5.0)
BILIRUBIN TOTAL: 0.9 mg/dL (ref 0.3–1.2)
BUN: 30 mg/dL — AB (ref 6–20)
CALCIUM: 8.3 mg/dL — AB (ref 8.9–10.3)
CHLORIDE: 100 mmol/L — AB (ref 101–111)
CO2: 27 mmol/L (ref 22–32)
Creatinine, Ser: 1.98 mg/dL — ABNORMAL HIGH (ref 0.61–1.24)
GFR calc non Af Amer: 38 mL/min — ABNORMAL LOW (ref >60)
GFR, EST AFRICAN AMERICAN: 44 mL/min — AB (ref >60)
Glucose, Bld: 108 mg/dL — ABNORMAL HIGH (ref 65–99)
Potassium: 3.5 mmol/L (ref 3.5–5.1)
Sodium: 134 mmol/L — ABNORMAL LOW (ref 135–145)
Total Protein: 7.2 g/dL (ref 6.5–8.1)

## 2014-09-11 LAB — CBC WITH DIFFERENTIAL/PLATELET
BASOS ABS: 0.1 10*3/uL (ref 0–0.1)
Basophils Relative: 1 %
EOS ABS: 0.1 10*3/uL (ref 0–0.7)
EOS PCT: 1 %
HCT: 40.1 % (ref 40.0–52.0)
HEMOGLOBIN: 13.5 g/dL (ref 13.0–18.0)
LYMPHS ABS: 1.3 10*3/uL (ref 1.0–3.6)
LYMPHS PCT: 21 %
MCH: 33.6 pg (ref 26.0–34.0)
MCHC: 33.7 g/dL (ref 32.0–36.0)
MCV: 99.6 fL (ref 80.0–100.0)
MONO ABS: 0.5 10*3/uL (ref 0.2–1.0)
Monocytes Relative: 8 %
NEUTROS PCT: 69 %
Neutro Abs: 4.3 10*3/uL (ref 1.4–6.5)
PLATELETS: 188 10*3/uL (ref 150–440)
RBC: 4.02 MIL/uL — ABNORMAL LOW (ref 4.40–5.90)
RDW: 13.1 % (ref 11.5–14.5)
WBC: 6.3 10*3/uL (ref 3.8–10.6)

## 2014-09-11 NOTE — Progress Notes (Signed)
Patient does not have living will.  Former smoker. 

## 2014-09-17 ENCOUNTER — Encounter: Payer: Self-pay | Admitting: Oncology

## 2014-09-17 NOTE — Progress Notes (Signed)
[place] @ [facility] [phone]) [phone]  Fax:([phone]) [place] OB: 30-Sep-1965  MR#: [id number]  QDI#:[id number]  Patient Care Team: Dion Body, MD as PCP - General (Family Medicine) [name], MD (Unknown Physician Specialty) Nestor Lewandowsky, MD as Referring Physician (Cardiothoracic Surgery) Grace Isaac, MD as Consulting Physician (Cardiothoracic Surgery)  CHIEF COMPLAINT:  Chief Complaint  Patient presents with  . 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  status post 3 cycles of chemotherapy with cis-platinum and gemcitabine (allergic to Taxol chemotherapy) 2.left lower extremity thrombosis.  On   xeralto (April, 2015) 4.has finished order 4 cyclesOf chemotherapy with cis-platinum and gemcitabine (may of 2015) 5.patient underwent resection of the right lung mass  Possibility of microscopic invasion into the mediastinum could not be ruled out.  (August, 2015) EGFR not mutated. ALK  no  REARRANGEMENT WAS FOUND  6.patient decided to stop radiation therapy because of side effect in October of 2015. Patient has been taken on xeralto July 2 016 (patient has been on xeralto for more than one year)     Lung cancer, right upper lobe   06/30/2013 Initial Diagnosis Lung cancer, right upper lobe    Oncology Flowsheet 09/05/2013  ondansetron (ZOFRAN) IV -  promethazine (PHENERGAN) IV 48.8 mg   T55-year-old patient with a history of carcinoma of lung right upper lobe status post chemotherapy resection patient had refused radiation therapy INTERVAL HISTORY: Patient came today for further follow-up patient is being on xeralto for approximately 1 year now.  She has been discontinued. No cough no shortness of breath patient has stopped smoking working full-time.  No hemoptysis no chest pain  REVIEW  OF SYSTEMS:   GENERAL:  Feels good.  Active.  No fevers, sweats or weight loss. PERFORMANCE STATUS (ECOG):  0 HEENT:  No visual changes, runny nose, sore throat, mouth sores or tenderness. Lungs: No shortness of breath or cough.  No hemoptysis. Cardiac:  No chest pain, palpitations, orthopnea, or PND. GI:  No nausea, vomiting, diarrhea, constipation, melena or hematochezia. GU:  No urgency, frequency, dysuria, or hematuria. Musculoskeletal:  No back pain.  No joint pain.  No muscle tenderness. Extremities:  No pain or swelling. Skin:  No rashes or skin changes. Neuro:  No headache, numbness or weakness, balance or coordination issues. Endocrine:  No diabetes, thyroid issues, hot flashes or night sweats. Psych:  No mood changes, depression or anxiety. Pain:  No focal pain. Review of systems:  All other systems reviewed and found to be negative. As per HPI. Otherwise, a complete review of systems is negatve.  PAST MEDICAL HISTORY: Past Medical History  Diagnosis Date  . Lung cancer     T3,N0,M0, CT-guided needle BX's positive for poorly differentiated carcinoma  . GERD (gastroesophageal reflux disease)   . Kidney stones   . Anemia   . Hypertension   . History of blood transfusion   . Left leg DVT     S/P IVC filter placement 7/10, on Xarelto    PAST SURGICAL HISTORY: Past Surgical History  Procedure Laterality Date  . Cyst removed off of right side of neck    . Inguinal hernia repair    . Port-a-cath placement    . Video bronchoscopy N/A 09/05/2013    Procedure: VIDEO BRONCHOSCOPY;  Surgeon: Grace Isaac, MD;  Location: [facility];  Service: Thoracic;  Laterality: N/A;  . Video assisted thoracoscopy (vats)/wedge resection Right 09/05/2013    Procedure: VIDEO ASSISTED THORACOSCOPY (VATS); Right Thoracotomy; Right Upper Lobectomy with Node Dissection and Enblock Resection of Mediastinal Invasion and Placement of OnQ Pain Pump;  Surgeon: Grace Isaac, MD;  Location: [facility] OR;   Service: Thoracic;  Laterality: Right;    Significant History/PMH:   DVT:    GERD - Esophageal Reflux:    Kidney Stones:    Anemia:    Lung Cancer:    HTN:    rt upper lobectomy:    cyst removed off of right side of neck:    inguinal hernia repair:   Preventive Screening:  Has patient had any of the following test? Prostate Exam (1)   Last Prostate Exam: does not remember when last examined(1)   Smoking History: Smoking History quit in 2007(1)  PFSH: Comments: No family history of colorectal cancer, breast cancer, or ovarian cancer.  Social History: negative alcohol, negative tobacco  Additional Past Medical and Surgical History: gastroesophagelux disease   ADVANCED DIRECTIVES:  No flowsheet data found.  HEALTH MAINTENANCE: History  Substance Use Topics  . Smoking status: Former Smoker -- 1.00 packs/day for 20 years    Types: Cigarettes    Quit date: 03/02/2005  . Smokeless tobacco: Never Used  . Alcohol Use: Yes     Comment: ocassionally      Allergies  Allergen Reactions  . Emend [Aprepitant] Anaphylaxis  . Paclitaxel Anaphylaxis    Current Outpatient Prescriptions  Medication Sig Dispense Refill  . amoxicillin (AMOXIL) 875 MG tablet Take 1 tablet (875 mg total) by mouth 2 (two) times daily. 20 tablet 0  . Fexofenadine HCl (ALLEGRA PO) Take 1 tablet by mouth daily as needed (allergies).    . hydrochlorothiazide (HYDRODIURIL) 25 MG tablet Take 25 mg by mouth daily.    . nebivolol (BYSTOLIC) 5 MG tablet Take 5 mg by mouth daily.    Earney Navy Bicarbonate (ZEGERID PO) Take 1 capsule by mouth daily as needed (acid reflux).    Marland Kitchen oxyCODONE (OXY IR/ROXICODONE) 5 MG immediate release tablet Take 1-2 tablets (5-10 mg total) by mouth every 6 (six) hours as needed for moderate pain or severe pain. 60 tablet 0  . rivaroxaban (XARELTO) 20 MG TABS tablet Take 20 mg by mouth daily with supper.     No current facility-administered medications for this  visit.    OBJECTIVE:  Filed Vitals:   09/11/14 1606  BP: 146/94  Pulse: 55  Temp: 95.8 F (35.4 C)     Body mass index is 30.69 kg/(m^2).    ECOG FS:0 - Asymptomatic  PHYSICAL EXAM:  GENERAL:  Well developed, well nourished, sitting comfortably in the exam room in no acute distress. MENTAL STATUS:  Alert and oriented to person, place and time.  ENT:  Oropharynx clear without lesion.  Tongue normal. Mucous membranes moist.  RESPIRATORY:  Clear to auscultation without rales, wheezes or rhonchi. CARDIOVASCULAR:  Regular rate and rhythm without murmur, rub or gallop. e. ABDOMEN:  Soft, non-tender, with active bowel sounds, and no hepatosplenomegaly.  No masses. BACK:  No CVA tenderness.  No tenderness on percussion of the back or rib cage. SKIN:  No rashes, ulcers or lesions. EXTREMITIES: No edema, no skin discoloration or tenderness.  No palpable cords. LYMPH NODES: No palpable cervical, supraclavicular, axillary or inguinal adenopathy  NEUROLOGICAL: Unremarkable. PSYCH:  Appropriate.  LAB RESULTS:  Appointment on 09/11/2014  Component Date Value Ref Range  Status  . WBC 09/11/2014 6.3  3.8 - 10.6 K/uL Final  . RBC 09/11/2014 4.02* 4.40 - 5.90 MIL/uL Final  . Hemoglobin 09/11/2014 13.5  13.0 - 18.0 g/dL Final  . HCT 09/11/2014 40.1  40.0 - 52.0 % Final  . MCV 09/11/2014 99.6  80.0 - 100.0 fL Final  . MCH 09/11/2014 33.6  26.0 - 34.0 pg Final  . MCHC 09/11/2014 33.7  32.0 - 36.0 g/dL Final  . RDW 09/11/2014 13.1  11.5 - 14.5 % Final  . Platelets 09/11/2014 188  150 - 440 K/uL Final  . Neutrophils Relative % 09/11/2014 69   Final  . Neutro Abs 09/11/2014 4.3  1.4 - 6.5 K/uL Final  . Lymphocytes Relative 09/11/2014 21   Final  . Lymphs Abs 09/11/2014 1.3  1.0 - 3.6 K/uL Final  . Monocytes Relative 09/11/2014 8   Final  . Monocytes Absolute 09/11/2014 0.5  0.2 - 1.0 K/uL Final  . Eosinophils Relative 09/11/2014 1   Final  . Eosinophils Absolute 09/11/2014 0.1  0 - 0.7 K/uL  Final  . Basophils Relative 09/11/2014 1   Final  . Basophils Absolute 09/11/2014 0.1  0 - 0.1 K/uL Final  . Sodium 09/11/2014 134* 135 - 145 mmol/L Final  . Potassium 09/11/2014 3.5  3.5 - 5.1 mmol/L Final  . Chloride 09/11/2014 100* 101 - 111 mmol/L Final  . CO2 09/11/2014 27  22 - 32 mmol/L Final  . Glucose, Bld 09/11/2014 108* 65 - 99 mg/dL Final  . BUN 09/11/2014 30* 6 - 20 mg/dL Final  . Creatinine, Ser 09/11/2014 1.98* 0.61 - 1.24 mg/dL Final  . Calcium 09/11/2014 8.3* 8.9 - 10.3 mg/dL Final  . Total Protein 09/11/2014 7.2  6.5 - 8.1 g/dL Final  . Albumin 09/11/2014 4.3  3.5 - 5.0 g/dL Final  . AST 09/11/2014 21  15 - 41 U/L Final  . ALT 09/11/2014 16* 17 - 63 U/L Final  . Alkaline Phosphatase 09/11/2014 60  38 - 126 U/L Final  . Total Bilirubin 09/11/2014 0.9  0.3 - 1.2 mg/dL Final  . GFR calc non Af Amer 09/11/2014 38* >60 mL/min Final  . GFR calc Af Amer 09/11/2014 44* >60 mL/min Final   Comment: (NOTE) The eGFR has been calculated using the CKD EPI equation. This calculation has not been validated in all clinical situations. eGFR's persistently <60 mL/min signify possible Chronic Kidney Disease.   . Anion gap 09/11/2014 7  5 - 15 Final      ASSESSMENT: Carcinoma of lung status post resection after neoadjuvant chemotherapy   MEDICAL DECISION MAKING:  On clinical ground there is no evidence of recurrent disease. Alen Blew has been discontinued as patient was on xeralto for more than a year CT scan of chest without contrast (abnormal serum creatinine) as well as patient's insurance refusal to pay for PET scan We be done in October.  If it is abnormal then PET scan would be recommended  Patient expressed understanding and was in agreement with this plan. He also understands that He can call clinic at any time with any questions, concerns, or complaints.    Lung cancer, right upper lobe   Staging form: Lung, AJCC 7th Edition     Clinical: No stage assigned -  Unsigned     Pathologic: Stage IIIA (T4, N0, cM0) - Signed by Grace Isaac, MD on 09/29/2013   [name], MD   09/17/2014 10:33 AM

## 2014-09-26 ENCOUNTER — Other Ambulatory Visit: Payer: Self-pay | Admitting: Oncology

## 2014-10-02 ENCOUNTER — Telehealth: Payer: Self-pay | Admitting: *Deleted

## 2014-10-02 DIAGNOSIS — G8918 Other acute postprocedural pain: Secondary | ICD-10-CM

## 2014-10-02 MED ORDER — OXYCODONE HCL 5 MG PO TABS: 5.0000 mg | ORAL_TABLET | Freq: Four times a day (QID) | ORAL | Status: DC | PRN

## 2014-10-02 NOTE — Telephone Encounter (Signed)
Informed that prescription is ready to pick up  

## 2014-10-31 ENCOUNTER — Other Ambulatory Visit: Payer: Self-pay | Admitting: *Deleted

## 2014-10-31 DIAGNOSIS — G8918 Other acute postprocedural pain: Secondary | ICD-10-CM

## 2014-10-31 MED ORDER — OXYCODONE HCL 5 MG PO TABS: 5.0000 mg | ORAL_TABLET | Freq: Four times a day (QID) | ORAL | Status: DC | PRN

## 2014-10-31 NOTE — Telephone Encounter (Signed)
Informed that prescription is ready to pick up  

## 2014-11-01 ENCOUNTER — Ambulatory Visit: Payer: BLUE CROSS/BLUE SHIELD

## 2014-11-01 ENCOUNTER — Encounter: Payer: Self-pay | Admitting: Emergency Medicine

## 2014-11-01 ENCOUNTER — Ambulatory Visit
Admission: EM | Admit: 2014-11-01 | Discharge: 2014-11-01 | Disposition: A | Discharge status: Home / Self Care | Payer: BLUE CROSS/BLUE SHIELD | Attending: Family Medicine | Admitting: Family Medicine

## 2014-11-01 DIAGNOSIS — B349 Viral infection, unspecified: Secondary | ICD-10-CM | POA: Diagnosis not present

## 2014-11-01 LAB — CBC WITH DIFFERENTIAL/PLATELET
BASOS ABS: 0 10*3/uL (ref 0–0.1)
Basophils Relative: 1 %
EOS ABS: 0 10*3/uL (ref 0–0.7)
EOS PCT: 0 %
HCT: 42.9 % (ref 40.0–52.0)
HEMOGLOBIN: 14.7 g/dL (ref 13.0–18.0)
Lymphocytes Relative: 11 %
Lymphs Abs: 0.5 10*3/uL — ABNORMAL LOW (ref 1.0–3.6)
MCH: 33.9 pg (ref 26.0–34.0)
MCHC: 34.2 g/dL (ref 32.0–36.0)
MCV: 99.2 fL (ref 80.0–100.0)
Monocytes Absolute: 0.4 10*3/uL (ref 0.2–1.0)
Monocytes Relative: 7 %
NEUTROS PCT: 81 %
Neutro Abs: 4.2 10*3/uL (ref 1.4–6.5)
PLATELETS: 128 10*3/uL — AB (ref 150–440)
RBC: 4.33 MIL/uL — AB (ref 4.40–5.90)
RDW: 13.1 % (ref 11.5–14.5)
WBC: 5.1 10*3/uL (ref 3.8–10.6)

## 2014-11-01 LAB — COMPREHENSIVE METABOLIC PANEL
ALBUMIN: 4.4 g/dL (ref 3.5–5.0)
ALK PHOS: 67 U/L (ref 38–126)
ALT: 17 U/L (ref 17–63)
AST: 24 U/L (ref 15–41)
Anion gap: 12 (ref 5–15)
BUN: 25 mg/dL — AB (ref 6–20)
CALCIUM: 9.2 mg/dL (ref 8.9–10.3)
CHLORIDE: 97 mmol/L — AB (ref 101–111)
CO2: 25 mmol/L (ref 22–32)
CREATININE: 1.93 mg/dL — AB (ref 0.61–1.24)
GFR calc Af Amer: 45 mL/min — ABNORMAL LOW (ref >60)
GFR calc non Af Amer: 39 mL/min — ABNORMAL LOW (ref >60)
GLUCOSE: 109 mg/dL — AB (ref 65–99)
Potassium: 3.4 mmol/L — ABNORMAL LOW (ref 3.5–5.1)
SODIUM: 134 mmol/L — AB (ref 135–145)
Total Bilirubin: 1.1 mg/dL (ref 0.3–1.2)
Total Protein: 7.7 g/dL (ref 6.5–8.1)

## 2014-11-01 LAB — RAPID INFLUENZA A&B ANTIGENS
Influenza A (ARMC): NOT DETECTED
Influenza B (ARMC): NOT DETECTED

## 2014-11-01 NOTE — ED Provider Notes (Addendum)
[facility] Emergency Department Provider Note  ____________________________________________  Time seen: Approximately 9:51 AM  I have reviewed the triage vital signs and the nursing notes.   HISTORY  Chief Complaint Fever   HPI Karl Wise is a 49 y.o. male presents for complaints of x 24 hours of feeling intermittent body aches and like he had a fever with accompanying runny nose and intermittent cough. States did not check his fever at home but felt warm. Reports history of some seasonal allergies with recent intermittent runny nose worse in last day. Reports several coworkers with bronchitis and other cough congestion sickness in last week.  Denies chest pain, shortness of breath, weakness, dizziness, abdominal pain. Denies dizziness, weakness, nausea, vomiting, diarrhea.denies feeling lightheaded, denies appetite changes.  Denies arm or leg pain. Denies calf pain.  Reports continues to eat and drink well. Denies pain.  States wanted to make sure he did not have pneumonia.    Past Medical History  Diagnosis Date  . Lung cancer     T3,N0,M0, CT-guided needle BX's positive for poorly differentiated carcinoma  . GERD (gastroesophageal reflux disease)   . Kidney stones   . Anemia   . Hypertension   . History of blood transfusion   . 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 Left leg DVT   . 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 Dvt femoral (deep venous thrombosis) 08/11/2013  . Chronic anticoagulation 08/11/2013  . Lung cancer, right upper lobe 06/30/2013  Reports left leg DVT post chemotherapy. States had been on xarelto but reports taken off several months ago as DVT had resolved.   Past Surgical History  Procedure Laterality Date  . Cyst removed off of right side of neck    . Inguinal hernia  repair    . Port-a-cath placement    . Video bronchoscopy N/A 09/05/2013    Procedure: VIDEO BRONCHOSCOPY;  Surgeon: Grace Isaac, MD;  Location: [facility] OR;  Service: Thoracic;  Laterality: N/A;  . Video assisted thoracoscopy (vats)/wedge resection Right 09/05/2013    Procedure: VIDEO ASSISTED THORACOSCOPY (VATS); Right Thoracotomy; Right Upper Lobectomy with Node Dissection and Enblock Resection of Mediastinal Invasion and Placement of OnQ Pain Pump;  Surgeon: Grace Isaac, MD;  Location: [facility] OR;  Service: Thoracic;  Laterality: Right;    Current Outpatient Rx  Name  Route  Sig  Dispense  Refill  .           Marland Kitchen Fexofenadine HCl (ALLEGRA PO)   Oral   Take 1 tablet by mouth daily as needed (allergies).         . hydrochlorothiazide (HYDRODIURIL) 25 MG tablet      TAKE 1 TABLET BY MOUTH EVERY DAY   30 tablet   6   . nebivolol (BYSTOLIC) 5 MG tablet   Oral   Take 5 mg by mouth daily.         Earney Navy Bicarbonate (ZEGERID PO)   Oral   Take 1 capsule by mouth daily as needed (acid reflux).         Marland Kitchen oxyCODONE (OXY IR/ROXICODONE) 5 MG immediate release tablet   Oral   Take 1-2 tablets (5-10 mg total) by mouth every 6 (six) hours as needed for moderate pain or severe pain.   [place]  tablet   0   . rivaroxaban (XARELTO) 20 MG TABS tablet   Oral   Take 20 mg by mouth daily with supper.         States no longer on xarelto. States off x several months. States otherwise no medication changes.  PCP: Cordelia Poche Onc: choski  Allergies Emend and Paclitaxel  History reviewed. No pertinent family history.  Social History Social History  Substance Use Topics  . Smoking status: Former Smoker -- 1.00 packs/day for 20 years    Types: Cigarettes    Quit date: 03/02/2005  . Smokeless tobacco: Never Used  . Alcohol Use: Yes     Comment: ocassionally    Review of Systems Constitutional: No fever/chills Eyes: No visual changes. ENT: runny nose, congestion and  intermittent cough. Cardiovascular: Denies chest pain. Respiratory: Denies shortness of breath. Gastrointestinal: No abdominal pain.  No nausea, no vomiting.  No diarrhea.  No constipation. Genitourinary: Negative for dysuria. Musculoskeletal: Negative for back pain. Skin: Negative for rash. Neurological: Negative for headaches, focal weakness or numbness.Denies dizziness.   10-point ROS otherwise negative.  ____________________________________________   PHYSICAL EXAM:  VITAL SIGNS: ED Triage Vitals  Enc Vitals Group     BP 11/01/14 0911 123/98 mmHg     Pulse Rate 11/01/14 0911 65     Resp 11/01/14 0911 20     Temp 11/01/14 0911 98.1 F (36.7 C)     Temp Source 11/01/14 0911 Tympanic     SpO2 11/01/14 0911 96 %     Weight 11/01/14 0911 190 lb (86.183 kg)     Height 11/01/14 0911 5\' 6"  (1.676 m)     Head Cir --      Peak Flow --      Pain Score 11/01/14 0913 2     Pain Loc --      Pain Edu? --      Excl. in GC? --    Today's Vitals   11/01/14 0911 11/01/14 0913 11/01/14 1058 11/01/14 1212  BP: 123/98  129/90   Pulse: 65  89   Temp: 98.1 F (36.7 C)  98 F (36.7 C)   TempSrc: Tympanic  Tympanic   Resp: 20  20   Height: 5\' 6"  (1.676 m)     Weight: 190 lb (86.183 kg)     SpO2: 96%  99%   PainSc:  2   2     Constitutional: Alert and oriented. Well appearing and in no acute distress. Eyes: Conjunctivae are normal. PERRL. EOMI. Head: Atraumatic. No sinus tenderness to palpation.  Ears: no erythema. Bilateral TM with  Dullness, right ear with mild effusion, no erythema or swelling.   Nose: mild clear rhinorrhea, mild bilateral nasal turbinate edema, bilateral nares patent.   Mouth/Throat: Mucous membranes are moist.  Oropharynx non-erythematous. Neck: No stridor.  No cervical spine tenderness to palpation. Hematological/Lymphatic/Immunilogical: No cervical lymphadenopathy. Cardiovascular: Normal rate, regular rhythm. Grossly normal heart sounds.  Good peripheral  circulation. Respiratory: Normal respiratory effort.  No retractions. Lungs clear throughout bilaterally. No wheezes, rales or rhonchi. Right upper lobe unable to assess breath sounds as history of surgery.  Gastrointestinal: Soft and nontender. No distention. Normal Bowel sounds.  No abdominal bruits. No CVA tenderness. Musculoskeletal: No lower or upper extremity tenderness nor edema.  No joint effusions. Bilateral pedal pulses equal and easily palpated. Bilateral calfs nontender. No leg swelling.  Neurologic:  Normal speech and language. No gross focal neurologic deficits are appreciated. No gait instability. Skin:  Skin is warm, dry and intact. No rash noted. Psychiatric: Mood and affect are normal. Speech and behavior are normal.  ____________________________________________   LABS (all labs ordered are listed, but only abnormal results are displayed)  Labs Reviewed  CBC WITH DIFFERENTIAL/PLATELET - Abnormal; Notable for the following:    RBC 4.33 (*)    Platelets 128 (*)    Lymphs Abs 0.5 (*)    All other components within normal limits  COMPREHENSIVE METABOLIC PANEL - Abnormal; Notable for the following:    Sodium 134 (*)    Potassium 3.4 (*)    Chloride 97 (*)    Glucose, Bld 109 (*)    BUN 25 (*)    Creatinine, Ser 1.93 (*)    GFR calc non Af Amer 39 (*)    GFR calc Af Amer 45 (*)    All other components within normal limits  INFLUENZA A&B ANTIGENS ([facility] ONLY)   Recent labs reviewed:   Recent Results (from the past 2160 hour(s))  CBC with Differential     Status: Abnormal   Collection Time: 09/11/14  3:42 PM  Result Value Ref Range   WBC 6.3 3.8 - 10.6 K/uL   RBC 4.02 (L) 4.40 - 5.90 MIL/uL   Hemoglobin 13.5 13.0 - 18.0 g/dL   HCT 40.1 40.0 - 52.0 %   MCV 99.6 80.0 - 100.0 fL   MCH 33.6 26.0 - 34.0 pg   MCHC 33.7 32.0 - 36.0 g/dL   RDW 13.1 11.5 - 14.5 %   Platelets 188 150 - 440 K/uL   Neutrophils Relative % 69 %   Neutro Abs 4.3 1.4 - 6.5 K/uL   Lymphocytes  Relative 21 %   Lymphs Abs 1.3 1.0 - 3.6 K/uL   Monocytes Relative 8 %   Monocytes Absolute 0.5 0.2 - 1.0 K/uL   Eosinophils Relative 1 %   Eosinophils Absolute 0.1 0 - 0.7 K/uL   Basophils Relative 1 %   Basophils Absolute 0.1 0 - 0.1 K/uL  Comprehensive metabolic panel     Status: Abnormal   Collection Time: 09/11/14  3:42 PM  Result Value Ref Range   Sodium 134 (L) 135 - 145 mmol/L   Potassium 3.5 3.5 - 5.1 mmol/L   Chloride 100 (L) 101 - 111 mmol/L   CO2 27 22 - 32 mmol/L   Glucose, Bld 108 (H) 65 - 99 mg/dL   BUN 30 (H) 6 - 20 mg/dL   Creatinine, Ser 1.98 (H) 0.61 - 1.24 mg/dL   Calcium 8.3 (L) 8.9 - 10.3 mg/dL   Total Protein 7.2 6.5 - 8.1 g/dL   Albumin 4.3 3.5 - 5.0 g/dL   AST 21 15 - 41 U/L   ALT 16 (L) 17 - 63 U/L   Alkaline Phosphatase 60 38 - 126 U/L   Total Bilirubin 0.9 0.3 - 1.2 mg/dL   GFR calc non Af Amer 38 (L) >60 mL/min   GFR calc Af Amer 44 (L) >60 mL/min    Comment: (NOTE) The eGFR has been calculated using the CKD EPI equation. This calculation has not been validated in all clinical situations. eGFR's persistently <60 mL/min signify possible Chronic Kidney Disease.    Anion gap 7 5 - 15  CBC with Differential     Status: Abnormal   Collection Time: 11/01/14  9:53 AM  Result Value Ref Range   WBC 5.1 3.8 - 10.6 K/uL   RBC 4.33 (L) 4.40 - 5.90 MIL/uL   Hemoglobin  14.7 13.0 - 18.0 g/dL   HCT 42.9 40.0 - 52.0 %   MCV 99.2 80.0 - 100.0 fL   MCH 33.9 26.0 - 34.0 pg   MCHC 34.2 32.0 - 36.0 g/dL   RDW 13.1 11.5 - 14.5 %   Platelets 128 (L) 150 - 440 K/uL   Neutrophils Relative % 81 %   Neutro Abs 4.2 1.4 - 6.5 K/uL   Lymphocytes Relative 11 %   Lymphs Abs 0.5 (L) 1.0 - 3.6 K/uL   Monocytes Relative 7 %   Monocytes Absolute 0.4 0.2 - 1.0 K/uL   Eosinophils Relative 0 %   Eosinophils Absolute 0.0 0 - 0.7 K/uL   Basophils Relative 1 %   Basophils Absolute 0.0 0 - 0.1 K/uL  Comprehensive metabolic panel     Status: Abnormal   Collection Time:  11/01/14  9:53 AM  Result Value Ref Range   Sodium 134 (L) 135 - 145 mmol/L   Potassium 3.4 (L) 3.5 - 5.1 mmol/L   Chloride 97 (L) 101 - 111 mmol/L   CO2 25 22 - 32 mmol/L   Glucose, Bld 109 (H) 65 - 99 mg/dL   BUN 25 (H) 6 - 20 mg/dL   Creatinine, Ser 1.93 (H) 0.61 - 1.24 mg/dL   Calcium 9.2 8.9 - 10.3 mg/dL   Total Protein 7.7 6.5 - 8.1 g/dL   Albumin 4.4 3.5 - 5.0 g/dL   AST 24 15 - 41 U/L   ALT 17 17 - 63 U/L   Alkaline Phosphatase 67 38 - 126 U/L   Total Bilirubin 1.1 0.3 - 1.2 mg/dL   GFR calc non Af Amer 39 (L) >60 mL/min   GFR calc Af Amer 45 (L) >60 mL/min    Comment: (NOTE) The eGFR has been calculated using the CKD EPI equation. This calculation has not been validated in all clinical situations. eGFR's persistently <60 mL/min signify possible Chronic Kidney Disease.    Anion gap 12 5 - 15  Influenza A&B Antigens ([facility] only)     Status: None   Collection Time: 11/01/14  9:53 AM  Result Value Ref Range   Influenza A [facility]) NOT DETECTED    Influenza B [facility]) NOT DETECTED    RADIOLOGY  EXAM: CHEST 2 VIEW  COMPARISON: Chest radiograph March 09, 2014 and chest CT June 08, 2014  FINDINGS: There is postoperative change with volume loss on the right. There is no edema or consolidation. Heart size and pulmonary vascularity are normal. No adenopathy. Port-A-Cath tip is in the superior vena cava. No pneumothorax. There is evidence of postoperative rib defects on the right. No blastic or lytic bone lesions.  IMPRESSION: Postoperative change with volume loss on the right. No edema or consolidation.   Electronically Signed By: Lowella Grip III M.D. On: 11/01/2014 10:13      I, Marylene Land, personally viewed and evaluated these images (plain radiographs) as part of my medical decision making.   __________________________________________   INITIAL IMPRESSION / ASSESSMENT AND PLAN / ED COURSE  Pertinent labs & imaging results that were  available during my care of the patient were reviewed by me and considered in my medical decision making (see chart for details).  Very well appearing patient. No acute distress. Presents for complaints of 24 hours of runny nose, intermittent cough and feeling that he may have had a fever. Reports multiple coworkers currently sick with cough, congestion and bronchitis. Denies chest pain, shortness of breath, weakness, dizziness or other complaints.  Continues to eat and drink well.    Labs reviewed. WBC 5.1. BUN/creatinine and other labs similar to previous labs without acute changes. Influenza negative. Chest xray negative for acute changes, no edema or consolidation. Patient very well appearing. Orthostatics reviewed  Orthostatic Lying  - BP- Lying: 143/90 mmHg ; Pulse- Lying: 45  Orthostatic Sitting - BP- Sitting: 129/90 mmHg ; Pulse- Sitting: 59  Orthostatic Standing at 0 minutes - BP- Standing at 0 minutes: 129/90 mmHg ; Pulse- Standing at 0 minutes: 60  Patient reports he has not eaten or drank fluids today as he didn't have time. Will encourage fluids and repeat orthostatics. Denies dizziness and denies dizziness or weakness with position changes. Denies chest pain or shortness of breath. Discussed patient and plan of care with Dr Posey Pronto who also reviewed labs and agrees plan of care. Lungs clear, well appearing, afebrile (and no tylenol or ibuprofen taken today), without signs of bacterial infection. Suspect viral illness. Likely from coworkers contact. Will reassess orthostatics. Discussed close follow up with primary care physician.   Orthostatic Lying  - BP- Lying: 159/88 mmHg ; Pulse- Lying: 48  Orthostatic Sitting - BP- Sitting: 148/85 mmHg ; Pulse- Sitting: 49  Orthostatic Standing at 0 minutes - BP- Standing at 0 minutes: 148/85 mmHg ; Pulse- Standing at 0 minutes: 60  Heart rate rechecked manually with position changes: lying: 62, sitting: 66, standing: 72  Orthostatics improved post  eating and drinking one cup of water. Heart rate rechecked manually as above. Discussed obtaining ekg, patient states does not want ekg and ready to go home.Patient alert and oriented with decisional capacity. Patient denies current complaints including weakness and dizziness. Patient states he feels better and wants to be discharged and go home. Very well appearing. Presents for complaints of runny nose, intermittent cough, and possible fever x one day. Discussed with patient to follow up with PCP this week in 2 days with PCP Dr Richarda Overlie. Discussed strict follow up and return parameters. Patient verbalized understanding and agreed to plan.  Discussed patient and plan of care with Dr Posey Pronto who reviewed labs, vitals and agrees with plan of care.   ____________________________________________   FINAL CLINICAL IMPRESSION(S) / ED DIAGNOSES  Final diagnoses:  Viral syndrome      Marylene Land, NP 11/01/14 1219   Marylene Land, NP 11/01/14 1422

## 2014-11-01 NOTE — ED Notes (Signed)
Pt with fever and cough x 24 hours

## 2014-11-01 NOTE — Discharge Instructions (Signed)
Rest. Drink plenty of fluid.   Follow up with your primary care physician closely. Follow up with your primary care physician in 2 days. Return to Urgent care or go to ER for fever, chest pain, shortness of breath, weakness, dizziness, new or worsening concerns.   Viral Infections A viral infection can be caused by different types of viruses.Most viral infections are not serious and resolve on their own. However, some infections may cause severe symptoms and may lead to further complications. SYMPTOMS Viruses can frequently cause:  Minor sore throat.  Aches and pains.  Headaches.  Runny nose.  Different types of rashes.  Watery eyes.  Tiredness.  Cough.  Loss of appetite.  Gastrointestinal infections, resulting in nausea, vomiting, and diarrhea. These symptoms do not respond to antibiotics because the infection is not caused by bacteria. However, you might catch a bacterial infection following the viral infection. This is sometimes called a "superinfection." Symptoms of such a bacterial infection may include:  Worsening sore throat with pus and difficulty swallowing.  Swollen neck glands.  Chills and a high or persistent fever.  Severe headache.  Tenderness over the sinuses.  Persistent overall ill feeling (malaise), muscle aches, and tiredness (fatigue).  Persistent cough.  Yellow, green, or brown mucus production with coughing. HOME CARE INSTRUCTIONS   Only take over-the-counter or prescription medicines for pain, discomfort, diarrhea, or fever as directed by your caregiver.  Drink enough water and fluids to keep your urine clear or pale yellow. Sports drinks can provide valuable electrolytes, sugars, and hydration.  Get plenty of rest and maintain proper nutrition. Soups and broths with crackers or rice are fine. SEEK IMMEDIATE MEDICAL CARE IF:   You have severe headaches, shortness of breath, chest pain, neck pain, or an unusual rash.  You have  uncontrolled vomiting, diarrhea, or you are unable to keep down fluids.  You or your child has an oral temperature above 102 F (38.9 C), not controlled by medicine.  Your baby is older than 3 months with a rectal temperature of 102 F (38.9 C) or higher.  Your baby is 67 months old or younger with a rectal temperature of 100.4 F (38 C) or higher. MAKE SURE YOU:   Understand these instructions.  Will watch your condition.  Will get help right away if you are not doing well or get worse. Document Released: 11/20/2004 Document Revised: 05/05/2011 Document Reviewed: 06/17/2010 [facility] Patient Information 2015 [place], [place]. This information is not intended to replace advice given to you by your health care provider. Make sure you discuss any questions you have with your health care provider.

## 2014-11-07 ENCOUNTER — Other Ambulatory Visit: Payer: Self-pay | Admitting: Oncology

## 2014-12-11 ENCOUNTER — Ambulatory Visit
Admission: RE | Admit: 2014-12-11 | Discharge: 2014-12-11 | Disposition: A | Discharge status: Home / Self Care | Payer: BLUE CROSS/BLUE SHIELD | Source: Ambulatory Visit | Attending: Oncology | Admitting: Oncology

## 2014-12-11 DIAGNOSIS — C3411 Malignant neoplasm of upper lobe, right bronchus or lung: Secondary | ICD-10-CM | POA: Insufficient documentation

## 2014-12-11 DIAGNOSIS — C3410 Malignant neoplasm of upper lobe, unspecified bronchus or lung: Secondary | ICD-10-CM

## 2014-12-14 ENCOUNTER — Inpatient Hospital Stay: Payer: BLUE CROSS/BLUE SHIELD | Attending: Oncology

## 2014-12-14 ENCOUNTER — Inpatient Hospital Stay (HOSPITAL_BASED_OUTPATIENT_CLINIC_OR_DEPARTMENT_OTHER): Payer: BLUE CROSS/BLUE SHIELD | Admitting: Oncology

## 2014-12-14 VITALS — BP 139/84 | HR 45 | Temp 96.4°F | Wt 193.3 lb

## 2014-12-14 DIAGNOSIS — R1011 Right upper quadrant pain: Secondary | ICD-10-CM

## 2014-12-14 DIAGNOSIS — C3410 Malignant neoplasm of upper lobe, unspecified bronchus or lung: Secondary | ICD-10-CM

## 2014-12-14 DIAGNOSIS — Z86718 Personal history of other venous thrombosis and embolism: Secondary | ICD-10-CM | POA: Insufficient documentation

## 2014-12-14 DIAGNOSIS — Z923 Personal history of irradiation: Secondary | ICD-10-CM

## 2014-12-14 DIAGNOSIS — Z85118 Personal history of other malignant neoplasm of bronchus and lung: Secondary | ICD-10-CM

## 2014-12-14 DIAGNOSIS — Z87891 Personal history of nicotine dependence: Secondary | ICD-10-CM

## 2014-12-14 DIAGNOSIS — Z9221 Personal history of antineoplastic chemotherapy: Secondary | ICD-10-CM

## 2014-12-14 DIAGNOSIS — K219 Gastro-esophageal reflux disease without esophagitis: Secondary | ICD-10-CM

## 2014-12-14 DIAGNOSIS — I10 Essential (primary) hypertension: Secondary | ICD-10-CM | POA: Diagnosis not present

## 2014-12-14 DIAGNOSIS — Z87442 Personal history of urinary calculi: Secondary | ICD-10-CM | POA: Diagnosis not present

## 2014-12-14 DIAGNOSIS — N189 Chronic kidney disease, unspecified: Secondary | ICD-10-CM | POA: Diagnosis not present

## 2014-12-14 DIAGNOSIS — Z23 Encounter for immunization: Secondary | ICD-10-CM | POA: Diagnosis not present

## 2014-12-14 LAB — COMPREHENSIVE METABOLIC PANEL
ALT: 18 U/L (ref 17–63)
ANION GAP: 4 — AB (ref 5–15)
AST: 24 U/L (ref 15–41)
Albumin: 4.4 g/dL (ref 3.5–5.0)
Alkaline Phosphatase: 61 U/L (ref 38–126)
BUN: 32 mg/dL — ABNORMAL HIGH (ref 6–20)
CO2: 28 mmol/L (ref 22–32)
Calcium: 8.2 mg/dL — ABNORMAL LOW (ref 8.9–10.3)
Chloride: 100 mmol/L — ABNORMAL LOW (ref 101–111)
Creatinine, Ser: 1.96 mg/dL — ABNORMAL HIGH (ref 0.61–1.24)
GFR calc non Af Amer: 38 mL/min — ABNORMAL LOW (ref >60)
GFR, EST AFRICAN AMERICAN: 44 mL/min — AB (ref >60)
Glucose, Bld: 104 mg/dL — ABNORMAL HIGH (ref 65–99)
POTASSIUM: 3.8 mmol/L (ref 3.5–5.1)
Sodium: 132 mmol/L — ABNORMAL LOW (ref 135–145)
Total Bilirubin: 1 mg/dL (ref 0.3–1.2)
Total Protein: 7.6 g/dL (ref 6.5–8.1)

## 2014-12-14 LAB — CBC WITH DIFFERENTIAL/PLATELET
Basophils Absolute: 0 10*3/uL (ref 0–0.1)
Basophils Relative: 1 %
EOS PCT: 2 %
Eosinophils Absolute: 0.1 10*3/uL (ref 0–0.7)
HCT: 40.3 % (ref 40.0–52.0)
Hemoglobin: 13.8 g/dL (ref 13.0–18.0)
LYMPHS ABS: 1.4 10*3/uL (ref 1.0–3.6)
Lymphocytes Relative: 22 %
MCH: 33.6 pg (ref 26.0–34.0)
MCHC: 34.2 g/dL (ref 32.0–36.0)
MCV: 98.2 fL (ref 80.0–100.0)
Monocytes Absolute: 0.5 10*3/uL (ref 0.2–1.0)
Monocytes Relative: 8 %
Neutro Abs: 4.4 10*3/uL (ref 1.4–6.5)
Neutrophils Relative %: 67 %
Platelets: 197 10*3/uL (ref 150–440)
RBC: 4.11 MIL/uL — AB (ref 4.40–5.90)
RDW: 13.3 % (ref 11.5–14.5)
WBC: 6.5 10*3/uL (ref 3.8–10.6)

## 2014-12-14 MED ORDER — INFLUENZA VAC SPLIT QUAD 0.5 ML IM SUSY
0.5000 mL | PREFILLED_SYRINGE | Freq: Once | INTRAMUSCULAR | Status: AC
Start: 2014-12-14 — End: 2014-12-14
  Administered 2014-12-14: 0.5 mL via INTRAMUSCULAR

## 2014-12-14 NOTE — Progress Notes (Signed)
Patient complains of discomfort in right abdomen.

## 2014-12-14 NOTE — Progress Notes (Signed)
[place] @ [facility] Telephone:(336) [phone]  Fax:(336) [place] OB: 21-Jul-1965  MR#: [id number]  EVO#:[id number]  Patient Care Team: Dion Body, MD as PCP - General (Family Medicine) [name], MD (Unknown Physician Specialty) Nestor Lewandowsky, MD as Referring Physician (Cardiothoracic Surgery) Grace Isaac, MD as Consulting Physician (Cardiothoracic Surgery)  CHIEF COMPLAINT:  Chief Complaint  Patient presents with  . 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  status post 3 cycles of chemotherapy with cis-platinum and gemcitabine (allergic to Taxol chemotherapy) 2.left lower extremity thrombosis.  On   xeralto (April, 2015) 4.has finished order 4 cyclesOf chemotherapy with cis-platinum and gemcitabine (may of 2015) 5.patient underwent resection of the right lung mass  Possibility of microscopic invasion into the mediastinum could not be ruled out.  (August, 2015) EGFR not mutated. ALK  no  REARRANGEMENT WAS FOUND  6.patient decided to stop radiation therapy because of side effect in October of 2015. Patient has been taken on xeralto July 2 016 (patient has been on xeralto for more than one year)     Lung cancer, right upper lobe   06/30/2013 Initial Diagnosis Lung cancer, right upper lobe    Oncology Flowsheet 09/05/2013  ondansetron (ZOFRAN) IV -  promethazine (PHENERGAN) IV 46.43 mg   T46-year[age] patient with a history of carcinoma of lung right upper lobe status post chemotherapy resection patient had refused radiation therapy INTERVAL HISTORY: 49 year old gentleman came today further follow-up regarding carcinoma of lung.  Patient had a repeat CT scan.  CT scan was done without contrast because of renal insufficiency. Patient complains of right upper quadrant pain describes as spasm-like  pain intermittent.  Has been going on for last 9-12 months not associated with any nausea vomiting diarrhea.  Not associated with diet Not radiating Patient is working full-time  REVIEW OF SYSTEMS:   GENERAL:  Feels good.  Active.  No fevers, sweats or weight loss. PERFORMANCE STATUS (ECOG):  0 HEENT:  No visual changes, runny nose, sore throat, mouth sores or tenderness. Lungs: No shortness of breath or cough.  No hemoptysis. Cardiac:  No chest pain, palpitations, orthopnea, or PND. GI:  No nausea, vomiting, diarrhea, constipation, melena or hematochezia. Right upper quadrant pain as described in history of present illness GU:  No urgency, frequency, dysuria, or hematuria. Musculoskeletal:  No back pain.  No joint pain.  No muscle tenderness. Extremities:  No pain or swelling. Skin:  No rashes or skin changes. Neuro:  No headache, numbness or weakness, balance or coordination issues. Endocrine:  No diabetes, thyroid issues, hot flashes or night sweats. Psych:  No mood changes, depression or anxiety. Pain:  No focal pain. Review of systems:  All other systems reviewed and found to be negative. As per HPI. Otherwise, a complete review of systems is negatve.  PAST MEDICAL HISTORY: Past Medical History  Diagnosis Date  . Lung cancer     T3,N0,M0, CT-guided needle BX's positive for poorly differentiated carcinoma  . GERD (gastroesophageal reflux disease)   . Kidney stones   . Anemia   . Hypertension   . History of blood transfusion   . Left leg DVT     S/P IVC filter placement 7/10, on Xarelto    PAST SURGICAL HISTORY: Past Surgical History  Procedure Laterality Date  . Cyst removed off of right side  of neck    . Inguinal hernia repair    . Port-a-cath placement    . Video bronchoscopy N/A 09/05/2013    Procedure: VIDEO BRONCHOSCOPY;  Surgeon: Delight Ovens, MD;  Location: [facility] OR;  Service: Thoracic;  Laterality: N/A;  . Video assisted thoracoscopy (vats)/wedge resection  Right 09/05/2013    Procedure: VIDEO ASSISTED THORACOSCOPY (VATS); Right Thoracotomy; Right Upper Lobectomy with Node Dissection and Enblock Resection of Mediastinal Invasion and Placement of OnQ Pain Pump;  Surgeon: Delight Ovens, MD;  Location: [facility] OR;  Service: Thoracic;  Laterality: Right;    Significant History/PMH:   DVT:    GERD - Esophageal Reflux:    Kidney Stones:    Anemia:    Lung Cancer:    HTN:    rt upper lobectomy:    cyst removed off of right side of neck:    inguinal hernia repair:   Preventive Screening:  Has patient had any of the following test? Prostate Exam (1)   Last Prostate Exam: does not remember when last examined(1)   Smoking History: Smoking History quit in 2007(1)  PFSH: Comments: No family history of colorectal cancer, breast cancer, or ovarian cancer.  Social History: negative alcohol, negative tobacco  Additional Past Medical and Surgical History: gastroesophagelux disease   ADVANCED DIRECTIVES:  Patient does not have any living will or healthcare power of attorney.  Information was given .  Available resources had been discussed.  We will follow-up on subsequent appointments regarding this issue HEALTH MAINTENANCE: Social History  Substance Use Topics  . Smoking status: Former Smoker -- 1.00 packs/day for 20 years    Types: Cigarettes    Quit date: 03/02/2005  . Smokeless tobacco: Never Used  . Alcohol Use: Yes     Comment: ocassionally      Allergies  Allergen Reactions  . Emend [Aprepitant] Anaphylaxis  . Paclitaxel Anaphylaxis      OBJECTIVE:  Filed Vitals:   12/14/14 1547  BP: 139/84  Pulse: 45  Temp: 96.4 F (35.8 C)     Body mass index is 31.22 kg/(m^2).    ECOG FS:0 - Asymptomatic  PHYSICAL EXAM:  GENERAL:  Well developed, well nourished, sitting comfortably in the exam room in no acute distress. MENTAL STATUS:  Alert and oriented to person, place and time.  ENT:  Oropharynx clear without lesion.   Tongue normal. Mucous membranes moist.  RESPIRATORY:  Clear to auscultation without rales, wheezes or rhonchi. CARDIOVASCULAR:  Regular rate and rhythm without murmur, rub or gallop.  ABDOMEN:  Soft, non-tender, with active bowel sounds, and no hepatosplenomegaly.  No masses. Tenderness in the right upper quadrant liver is not palpable.  Spleen is not palpable. BACK:  No CVA tenderness.  No tenderness on percussion of the back or rib cage. SKIN:  No rashes, ulcers or lesions. EXTREMITIES: No edema, no skin discoloration or tenderness.  No palpable cords. LYMPH NODES: No palpable cervical, supraclavicular, axillary or inguinal adenopathy  NEUROLOGICAL: Unremarkable. PSYCH:  Appropriate.  LAB RESULTS:  Appointment on 12/14/2014  Component Date Value Ref Range Status  . WBC 12/14/2014 6.5  3.8 - 10.6 K/uL Final  . RBC 12/14/2014 4.11* 4.40 - 5.90 MIL/uL Final  . Hemoglobin 12/14/2014 13.8  13.0 - 18.0 g/dL Final  . HCT 66/62/8835 40.3  40.0 - 52.0 % Final  . MCV 12/14/2014 98.2  80.0 - 100.0 fL Final  . MCH 12/14/2014 33.6  26.0 - 34.0 pg Final  . MCHC 12/14/2014 34.2  32.0 - 36.0 g/dL Final  . RDW 12/14/2014 13.3  11.5 - 14.5 % Final  . Platelets 12/14/2014 197  150 - 440 K/uL Final  . Neutrophils Relative % 12/14/2014 67   Final  . Neutro Abs 12/14/2014 4.4  1.4 - 6.5 K/uL Final  . Lymphocytes Relative 12/14/2014 22   Final  . Lymphs Abs 12/14/2014 1.4  1.0 - 3.6 K/uL Final  . Monocytes Relative 12/14/2014 8   Final  . Monocytes Absolute 12/14/2014 0.5  0.2 - 1.0 K/uL Final  . Eosinophils Relative 12/14/2014 2   Final  . Eosinophils Absolute 12/14/2014 0.1  0 - 0.7 K/uL Final  . Basophils Relative 12/14/2014 1   Final  . Basophils Absolute 12/14/2014 0.0  0 - 0.1 K/uL Final  . Sodium 12/14/2014 132* 135 - 145 mmol/L Final  . Potassium 12/14/2014 3.8  3.5 - 5.1 mmol/L Final  . Chloride 12/14/2014 100* 101 - 111 mmol/L Final  . CO2 12/14/2014 28  22 - 32 mmol/L Final  . Glucose,  Bld 12/14/2014 104* 65 - 99 mg/dL Final  . BUN 12/14/2014 32* 6 - 20 mg/dL Final  . Creatinine, Ser 12/14/2014 1.96* 0.61 - 1.24 mg/dL Final  . Calcium 12/14/2014 8.2* 8.9 - 10.3 mg/dL Final  . Total Protein 12/14/2014 7.6  6.5 - 8.1 g/dL Final  . Albumin 12/14/2014 4.4  3.5 - 5.0 g/dL Final  . AST 12/14/2014 24  15 - 41 U/L Final  . ALT 12/14/2014 18  17 - 63 U/L Final  . Alkaline Phosphatase 12/14/2014 61  38 - 126 U/L Final  . Total Bilirubin 12/14/2014 1.0  0.3 - 1.2 mg/dL Final  . GFR calc non Af Amer 12/14/2014 38* >60 mL/min Final  . GFR calc Af Amer 12/14/2014 44* >60 mL/min Final   Comment: (NOTE) The eGFR has been calculated using the CKD EPI equation. This calculation has not been validated in all clinical situations. eGFR's persistently <60 mL/min signify possible Chronic Kidney Disease.   . Anion gap 12/14/2014 4* 5 - 15 Final      ASSESSMENT: Carcinoma of lung status post resection after neoadjuvant chemotherapy Patient diffuse radiation therapy   MEDICAL DECISION MAKING:  On clinical ground there is no evidence of recurrent disease. CT scan without contrast (because of renal insufficiency) does not reveal any evidence of recurrent disease Right upper quadrant pain.  CT scan reveals gallstones and will get ultrasound of abdomen done if it reveals any problem with her gallbladder Patient will be deferred to surgical service for evaluation Ultrasound has been scheduled on October 24 would be reviewed Patient will be reevaluated in 4 months with a PET scan  Because of renal insufficiency patient cannot have evaluation with contrast CT scan  Patient expressed understanding and was in agreement with this plan. He also understands that He can call clinic at any time with any questions, concerns, or complaints.    Lung cancer, right upper lobe   Staging form: Lung, AJCC 7th Edition     Clinical: No stage assigned - Unsigned     Pathologic: Stage IIIA (T4, N0, cM0) -  Signed by Grace Isaac, MD on 09/29/2013   [name], MD   12/14/2014 4:03 PM

## 2014-12-15 ENCOUNTER — Encounter: Payer: Self-pay | Admitting: Oncology

## 2014-12-18 ENCOUNTER — Other Ambulatory Visit: Payer: Self-pay | Admitting: *Deleted

## 2014-12-18 ENCOUNTER — Ambulatory Visit
Admission: RE | Admit: 2014-12-18 | Discharge: 2014-12-18 | Disposition: A | Discharge status: Home / Self Care | Payer: BLUE CROSS/BLUE SHIELD | Source: Ambulatory Visit | Attending: Oncology | Admitting: Oncology

## 2014-12-18 ENCOUNTER — Telehealth: Payer: Self-pay | Admitting: *Deleted

## 2014-12-18 DIAGNOSIS — C3410 Malignant neoplasm of upper lobe, unspecified bronchus or lung: Secondary | ICD-10-CM

## 2014-12-18 DIAGNOSIS — K8020 Calculus of gallbladder without cholecystitis without obstruction: Secondary | ICD-10-CM | POA: Insufficient documentation

## 2014-12-18 DIAGNOSIS — R160 Hepatomegaly, not elsewhere classified: Secondary | ICD-10-CM

## 2014-12-18 DIAGNOSIS — R1011 Right upper quadrant pain: Secondary | ICD-10-CM | POA: Diagnosis present

## 2014-12-18 NOTE — Telephone Encounter (Signed)
Called patient to inform him that MD will be ordering an MRI due to US showing a something on the liver that was not seen on earlier studies.  Our schedulers will be calling him with that appointment. Patient verbalized understanding.

## 2014-12-19 ENCOUNTER — Telehealth: Payer: Self-pay | Admitting: *Deleted

## 2014-12-19 NOTE — Telephone Encounter (Signed)
Returned patient call and reviewed recent US ob abdomen report. Patient is thinking about cancelling MRI and will talk with wife more about it. Encouraged patient to not cancel MRI and to call back to discuss further if needed.

## 2014-12-28 ENCOUNTER — Ambulatory Visit: Payer: BLUE CROSS/BLUE SHIELD

## 2014-12-29 ENCOUNTER — Ambulatory Visit
Admission: RE | Admit: 2014-12-29 | Discharge: 2014-12-29 | Disposition: A | Discharge status: Home / Self Care | Payer: BLUE CROSS/BLUE SHIELD | Source: Ambulatory Visit | Attending: Oncology | Admitting: Oncology

## 2014-12-29 DIAGNOSIS — Z85118 Personal history of other malignant neoplasm of bronchus and lung: Secondary | ICD-10-CM | POA: Insufficient documentation

## 2014-12-29 DIAGNOSIS — C3410 Malignant neoplasm of upper lobe, unspecified bronchus or lung: Secondary | ICD-10-CM

## 2014-12-29 DIAGNOSIS — K769 Liver disease, unspecified: Secondary | ICD-10-CM | POA: Diagnosis not present

## 2014-12-29 DIAGNOSIS — R160 Hepatomegaly, not elsewhere classified: Secondary | ICD-10-CM | POA: Diagnosis present

## 2014-12-29 DIAGNOSIS — J986 Disorders of diaphragm: Secondary | ICD-10-CM | POA: Diagnosis not present

## 2014-12-29 DIAGNOSIS — K8020 Calculus of gallbladder without cholecystitis without obstruction: Secondary | ICD-10-CM | POA: Diagnosis not present

## 2014-12-29 MED ORDER — GADOXETATE DISODIUM 0.25 MMOL/ML IV SOLN
6.0000 mL | Freq: Once | INTRAVENOUS | Status: AC | PRN
  Administered 2014-12-29: 6 mL via INTRAVENOUS

## 2015-01-02 ENCOUNTER — Inpatient Hospital Stay: Payer: BLUE CROSS/BLUE SHIELD | Attending: Oncology | Admitting: Oncology

## 2015-01-02 ENCOUNTER — Encounter: Payer: Self-pay | Admitting: Oncology

## 2015-01-02 VITALS — BP 142/97 | HR 82 | Temp 97.5°F | Wt 197.1 lb

## 2015-01-02 DIAGNOSIS — I10 Essential (primary) hypertension: Secondary | ICD-10-CM

## 2015-01-02 DIAGNOSIS — D649 Anemia, unspecified: Secondary | ICD-10-CM | POA: Diagnosis not present

## 2015-01-02 DIAGNOSIS — Z86718 Personal history of other venous thrombosis and embolism: Secondary | ICD-10-CM | POA: Diagnosis not present

## 2015-01-02 DIAGNOSIS — Z87442 Personal history of urinary calculi: Secondary | ICD-10-CM | POA: Diagnosis not present

## 2015-01-02 DIAGNOSIS — Z9221 Personal history of antineoplastic chemotherapy: Secondary | ICD-10-CM

## 2015-01-02 DIAGNOSIS — Z923 Personal history of irradiation: Secondary | ICD-10-CM

## 2015-01-02 DIAGNOSIS — C3412 Malignant neoplasm of upper lobe, left bronchus or lung: Secondary | ICD-10-CM

## 2015-01-02 DIAGNOSIS — K8020 Calculus of gallbladder without cholecystitis without obstruction: Secondary | ICD-10-CM

## 2015-01-02 DIAGNOSIS — K219 Gastro-esophageal reflux disease without esophagitis: Secondary | ICD-10-CM

## 2015-01-02 DIAGNOSIS — Z87891 Personal history of nicotine dependence: Secondary | ICD-10-CM

## 2015-01-02 DIAGNOSIS — N289 Disorder of kidney and ureter, unspecified: Secondary | ICD-10-CM

## 2015-01-02 DIAGNOSIS — R1011 Right upper quadrant pain: Secondary | ICD-10-CM

## 2015-01-02 DIAGNOSIS — C3410 Malignant neoplasm of upper lobe, unspecified bronchus or lung: Secondary | ICD-10-CM

## 2015-01-02 NOTE — Progress Notes (Signed)
[place] @ [facility] Telephone:(336) [phone]  Fax:(336) [place] OB: [birth year]  MR#: [id number]  OVZ#:[id number]  Patient Care Team: Dion Body, MD as PCP - General (Family Medicine) [name], MD (Unknown Physician Specialty) Nestor Lewandowsky, MD as Referring Physician (Cardiothoracic Surgery) Grace Isaac, MD as Consulting Physician (Cardiothoracic Surgery)  CHIEF COMPLAINT:  Chief Complaint  Patient presents with  . 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  status post 3 cycles of chemotherapy with cis-platinum and gemcitabine (allergic to Taxol chemotherapy) 2.left lower extremity thrombosis.  On   xeralto (April, 2015) 4.has finished order 4 cyclesOf chemotherapy with cis-platinum and gemcitabine (may of 2015) 5.patient underwent resection of the right lung mass  Possibility of microscopic invasion into the mediastinum could not be ruled out.  (August, 2015) EGFR not mutated. ALK  no  REARRANGEMENT WAS FOUND  6.patient decided to stop radiation therapy because of side effect in October of 2015. Patient has been taken on xeralto July 2 016 (patient has been on xeralto for more than one year)     Lung cancer, right upper lobe   06/30/2013 Initial Diagnosis Lung cancer, right upper lobe    Oncology Flowsheet 09/05/2013  ondansetron (ZOFRAN) IV -  promethazine (PHENERGAN) IV 69.25 mg   T75-year[age]old patient with a history of carcinoma of lung right upper lobe status post chemotherapy resection patient had refused radiation therapy INTERVAL HISTORY: 49 year old gentleman came today further follow-up regarding carcinoma of lung.  Patient had a repeat CT scan.  CT scan was done without contrast because of renal insufficiency. Patient complains of right upper quadrant pain describes as  spasm-like pain intermittent.  Has been going on for last 9-12 months not associated with any nausea vomiting diarrhea.  Not associated with diet Not radiating Patient is working full-time.  Patient has persistent right upper quadrant pain off and on.  Ultrasound revealed cholelithiasis and also question was raised about liver lesion.  MRI scan was done Patient is here to discuss the results and further planning of treatment  REVIEW OF SYSTEMS:   GENERAL:  Feels good.  Active.  No fevers, sweats or weight loss. PERFORMANCE STATUS (ECOG):  0 HEENT:  No visual changes, runny nose, sore throat, mouth sores or tenderness. Lungs: No shortness of breath or cough.  No hemoptysis. Cardiac:  No chest pain, palpitations, orthopnea, or PND. GI:  No nausea, vomiting, diarrhea, constipation, melena or hematochezia. Intermittent right upper quadrant pain Right upper quadrant pain as described in history of present illness GU:  No urgency, frequency, dysuria, or hematuria. Musculoskeletal:  No back pain.  No joint pain.  No muscle tenderness. Extremities:  No pain or swelling. Skin:  No rashes or skin changes. Neuro:  No headache, numbness or weakness, balance or coordination issues. Endocrine:  No diabetes, thyroid issues, hot flashes or night sweats. Psych:  No mood changes, depression or anxiety. Pain:  No focal pain. Review of systems:  All other systems reviewed and found to be negative. As per HPI. Otherwise, a complete review of systems is negatve.  PAST MEDICAL HISTORY: Past Medical History  Diagnosis Date  . Lung cancer ([place])     T3,N0,M0, CT-guided needle BX's positive for poorly differentiated carcinoma  . GERD (gastroesophageal reflux disease)   . Kidney stones   . Anemia   .  Hypertension   . History of blood transfusion   . Left leg DVT ([facility])     S/P IVC filter placement 7/10, on Xarelto    PAST SURGICAL HISTORY: Past Surgical History  Procedure Laterality Date  . Cyst  removed off of right side of neck    . Inguinal hernia repair    . Port-a-cath placement    . Video bronchoscopy N/A 09/05/2013    Procedure: VIDEO BRONCHOSCOPY;  Surgeon: Grace Isaac, MD;  Location: [facility] OR;  Service: Thoracic;  Laterality: N/A;  . Video assisted thoracoscopy (vats)/wedge resection Right 09/05/2013    Procedure: VIDEO ASSISTED THORACOSCOPY (VATS); Right Thoracotomy; Right Upper Lobectomy with Node Dissection and Enblock Resection of Mediastinal Invasion and Placement of OnQ Pain Pump;  Surgeon: Grace Isaac, MD;  Location: [facility] OR;  Service: Thoracic;  Laterality: Right;    Significant History/PMH:   DVT:    GERD - Esophageal Reflux:    Kidney Stones:    Anemia:    Lung Cancer:    HTN:    rt upper lobectomy:    cyst removed off of right side of neck:    inguinal hernia repair:   Preventive Screening:  Has patient had any of the following test? Prostate Exam (1)   Last Prostate Exam: does not remember when last examined(1)   Smoking History: Smoking History quit in 2007(1)  PFSH: Comments: No family history of colorectal cancer, breast cancer, or ovarian cancer.  Social History: negative alcohol, negative tobacco  Additional Past Medical and Surgical History: gastroesophagelux disease   ADVANCED DIRECTIVES:  Patient does not have any living will or healthcare power of attorney.  Information was given .  Available resources had been discussed.  We will follow-up on subsequent appointments regarding this issue HEALTH MAINTENANCE: Social History  Substance Use Topics  . Smoking status: Former Smoker -- 1.00 packs/day for 20 years    Types: Cigarettes    Quit date: 03/02/2005  . Smokeless tobacco: Never Used  . Alcohol Use: Yes     Comment: ocassionally      Allergies  Allergen Reactions  . Emend [Aprepitant] Anaphylaxis  . Paclitaxel Anaphylaxis      OBJECTIVE:  Filed Vitals:   01/02/15 1439  BP: 142/97  Pulse: 82  Temp: 97.5  F (36.4 C)     Body mass index is 31.83 kg/(m^2).    ECOG FS:0 - Asymptomatic  PHYSICAL EXAM:  GENERAL:  Well developed, well nourished, sitting comfortably in the exam room in no acute distress. MENTAL STATUS:  Alert and oriented to person, place and time.  ENT:  Oropharynx clear without lesion.  Tongue normal. Mucous membranes moist.  RESPIRATORY:  Clear to auscultation without rales, wheezes or rhonchi. CARDIOVASCULAR:  Regular rate and rhythm without murmur, rub or gallop.  ABDOMEN:  Soft, non-tender, with active bowel sounds, and no hepatosplenomegaly.  No masses. Tenderness in the right upper quadrant liver is not palpable.  Spleen is not palpable. BACK:  No CVA tenderness.  No tenderness on percussion of the back or rib cage. SKIN:  No rashes, ulcers or lesions. EXTREMITIES: No edema, no skin discoloration or tenderness.  No palpable cords. LYMPH NODES: No palpable cervical, supraclavicular, axillary or inguinal adenopathy  NEUROLOGICAL: Unremarkable. PSYCH:  Appropriate.  LAB RESULTS:  No visits with results within 2 Day(s) from this visit. Latest known visit with results is:  Appointment on 12/14/2014  Component Date Value Ref Range Status  . WBC 12/14/2014 6.5  3.8 -  10.6 K/uL Final  . RBC 12/14/2014 4.11* 4.40 - 5.90 MIL/uL Final  . Hemoglobin 12/14/2014 13.8  13.0 - 18.0 g/dL Final  . HCT 12/14/2014 40.3  40.0 - 52.0 % Final  . MCV 12/14/2014 98.2  80.0 - 100.0 fL Final  . MCH 12/14/2014 33.6  26.0 - 34.0 pg Final  . MCHC 12/14/2014 34.2  32.0 - 36.0 g/dL Final  . RDW 12/14/2014 13.3  11.5 - 14.5 % Final  . Platelets 12/14/2014 197  150 - 440 K/uL Final  . Neutrophils Relative % 12/14/2014 67   Final  . Neutro Abs 12/14/2014 4.4  1.4 - 6.5 K/uL Final  . Lymphocytes Relative 12/14/2014 22   Final  . Lymphs Abs 12/14/2014 1.4  1.0 - 3.6 K/uL Final  . Monocytes Relative 12/14/2014 8   Final  . Monocytes Absolute 12/14/2014 0.5  0.2 - 1.0 K/uL Final  . Eosinophils  Relative 12/14/2014 2   Final  . Eosinophils Absolute 12/14/2014 0.1  0 - 0.7 K/uL Final  . Basophils Relative 12/14/2014 1   Final  . Basophils Absolute 12/14/2014 0.0  0 - 0.1 K/uL Final  . Sodium 12/14/2014 132* 135 - 145 mmol/L Final  . Potassium 12/14/2014 3.8  3.5 - 5.1 mmol/L Final  . Chloride 12/14/2014 100* 101 - 111 mmol/L Final  . CO2 12/14/2014 28  22 - 32 mmol/L Final  . Glucose, Bld 12/14/2014 104* 65 - 99 mg/dL Final  . BUN 12/14/2014 32* 6 - 20 mg/dL Final  . Creatinine, Ser 12/14/2014 1.96* 0.61 - 1.24 mg/dL Final  . Calcium 12/14/2014 8.2* 8.9 - 10.3 mg/dL Final  . Total Protein 12/14/2014 7.6  6.5 - 8.1 g/dL Final  . Albumin 12/14/2014 4.4  3.5 - 5.0 g/dL Final  . AST 12/14/2014 24  15 - 41 U/L Final  . ALT 12/14/2014 18  17 - 63 U/L Final  . Alkaline Phosphatase 12/14/2014 61  38 - 126 U/L Final  . Total Bilirubin 12/14/2014 1.0  0.3 - 1.2 mg/dL Final  . GFR calc non Af Amer 12/14/2014 38* >60 mL/min Final  . GFR calc Af Amer 12/14/2014 44* >60 mL/min Final   Comment: (NOTE) The eGFR has been calculated using the CKD EPI equation. This calculation has not been validated in all clinical situations. eGFR's persistently <60 mL/min signify possible Chronic Kidney Disease.   . Anion gap 12/14/2014 4* 5 - 15 Final      ASSESSMENT: Carcinoma of lung status post resection after neoadjuvant chemotherapy Patient has refused radiation therapy  MEDICAL DECISION MAKING:  Persistent right upper quadrant pain Ultrasound and MRI scan has been reviewed.  It appears that patient has benign Hamming Margorie John with cholelithiasis I am not sure cholelithiasis as the cause of right upper quadrant pain but will refer patient to surgical service for evaluation for possibility of cholecystectomy to see pain gets relieved Because of renal insufficiency patient cannot have evaluation with contrast CT scan Other options to continue observation All the x-rays were reviewed  independently Patient had a repeat PET scan scheduled in February Appointment will be As in February to see me again after PET scan is done Patient expressed understanding and was in agreement with this plan. He also understands that He can call clinic at any time with any questions, concerns, or complaints.    Lung cancer, right upper lobe   Staging form: Lung, AJCC 7th Edition     Clinical: No stage assigned - Unsigned  Pathologic: Stage IIIA (T4, N0, cM0) - Signed by Grace Isaac, MD on 09/29/2013   [name], MD   01/02/2015 3:00 PM

## 2015-01-09 ENCOUNTER — Other Ambulatory Visit: Payer: Self-pay | Admitting: Oncology

## 2015-01-22 ENCOUNTER — Encounter
Admission: RE | Admit: 2015-01-22 | Discharge: 2015-01-22 | Disposition: A | Discharge status: Home / Self Care | Payer: BLUE CROSS/BLUE SHIELD | Source: Ambulatory Visit | Attending: Surgery | Admitting: Surgery

## 2015-01-22 DIAGNOSIS — Z01818 Encounter for other preprocedural examination: Secondary | ICD-10-CM | POA: Diagnosis present

## 2015-01-22 DIAGNOSIS — I10 Essential (primary) hypertension: Secondary | ICD-10-CM

## 2015-01-22 DIAGNOSIS — Z01812 Encounter for preprocedural laboratory examination: Secondary | ICD-10-CM | POA: Diagnosis not present

## 2015-01-22 HISTORY — DX: Unspecified osteoarthritis, unspecified site: M19.90

## 2015-01-22 LAB — BASIC METABOLIC PANEL
ANION GAP: 8 (ref 5–15)
BUN: 28 mg/dL — ABNORMAL HIGH (ref 6–20)
CHLORIDE: 102 mmol/L (ref 101–111)
CO2: 27 mmol/L (ref 22–32)
CREATININE: 1.93 mg/dL — AB (ref 0.61–1.24)
Calcium: 9.3 mg/dL (ref 8.9–10.3)
GFR calc non Af Amer: 39 mL/min — ABNORMAL LOW (ref >60)
GFR, EST AFRICAN AMERICAN: 45 mL/min — AB (ref >60)
Glucose, Bld: 101 mg/dL — ABNORMAL HIGH (ref 65–99)
POTASSIUM: 4.6 mmol/L (ref 3.5–5.1)
SODIUM: 137 mmol/L (ref 135–145)

## 2015-01-22 NOTE — Patient Instructions (Signed)
  Your procedure is scheduled GD:JMEQASTM 2, 2016 (Friday) Report to Day Surgery.[facility]) To find out your arrival time please call [phone] between 1PM - 3PM on January 25, 2015 (Thursday).  Remember: Instructions that are not followed completely may result in serious medical risk, up to and including death, or upon the discretion of your surgeon and anesthesiologist your surgery may need to be rescheduled.    __x__ 1. Do not eat food or drink liquids after midnight. No gum chewing or hard candies.     __x__ 2. No Alcohol for 24 hours before or after surgery.   ____ 3. Bring all medications with you on the day of surgery if instructed.    __x__ 4. Notify your doctor if there is any change in your medical condition     (cold, fever, infections).     Do not wear jewelry, make-up, hairpins, clips or nail polish.  Do not wear lotions, powders, or perfumes. You may wear deodorant.  Do not shave 48 hours prior to surgery. Men may shave face and neck.  Do not bring valuables to the hospital.    [facility] is not responsible for any belongings or valuables.               Contacts, dentures or bridgework may not be worn into surgery.  Leave your suitcase in the car. After surgery it may be brought to your room.  For patients admitted to the hospital, discharge time is determined by your                treatment team.   Patients discharged the day of surgery will not be allowed to drive home.   Please read over the following fact sheets that you were given:   Surgical Site Infection Prevention   ____ Take these medicines the morning of surgery with A SIP OF WATER:    1. Bystolic  2.   3.   4.  5.  6.  ____ Fleet Enema (as directed)   _x___ Use CHG Soap as directed  ____ Use inhalers on the day of surgery  ____ Stop metformin 2 days prior to surgery    ____ Take 1/2 of usual insulin dose the night before surgery and none on the morning of surgery.   ____ Stop  Coumadin/Plavix/aspirin on   ____ Stop Anti-inflammatories on    ____ Stop supplements until after surgery.    ____ Bring C-Pap to the hospital.

## 2015-01-23 ENCOUNTER — Encounter: Payer: Self-pay | Admitting: *Deleted

## 2015-01-26 ENCOUNTER — Ambulatory Visit: Payer: BLUE CROSS/BLUE SHIELD

## 2015-01-26 ENCOUNTER — Ambulatory Visit
Admission: RE | Admit: 2015-01-26 | Discharge: 2015-01-26 | Disposition: A | Discharge status: Home / Self Care | Payer: BLUE CROSS/BLUE SHIELD | Source: Ambulatory Visit | Attending: Surgery | Admitting: Surgery

## 2015-01-26 ENCOUNTER — Ambulatory Visit: Payer: BLUE CROSS/BLUE SHIELD | Admitting: Anesthesiology

## 2015-01-26 ENCOUNTER — Encounter: Payer: Self-pay | Admitting: Anesthesiology

## 2015-01-26 ENCOUNTER — Encounter
Admission: RE | Disposition: A | Discharge status: Home / Self Care | Payer: Self-pay | Source: Ambulatory Visit | Attending: Surgery

## 2015-01-26 DIAGNOSIS — Z85118 Personal history of other malignant neoplasm of bronchus and lung: Secondary | ICD-10-CM | POA: Diagnosis not present

## 2015-01-26 DIAGNOSIS — Z888 Allergy status to other drugs, medicaments and biological substances status: Secondary | ICD-10-CM | POA: Insufficient documentation

## 2015-01-26 DIAGNOSIS — K8010 Calculus of gallbladder with chronic cholecystitis without obstruction: Secondary | ICD-10-CM | POA: Insufficient documentation

## 2015-01-26 DIAGNOSIS — I10 Essential (primary) hypertension: Secondary | ICD-10-CM | POA: Insufficient documentation

## 2015-01-26 DIAGNOSIS — Z79899 Other long term (current) drug therapy: Secondary | ICD-10-CM | POA: Insufficient documentation

## 2015-01-26 DIAGNOSIS — Z8249 Family history of ischemic heart disease and other diseases of the circulatory system: Secondary | ICD-10-CM | POA: Insufficient documentation

## 2015-01-26 DIAGNOSIS — Z87891 Personal history of nicotine dependence: Secondary | ICD-10-CM | POA: Diagnosis not present

## 2015-01-26 DIAGNOSIS — N209 Urinary calculus, unspecified: Secondary | ICD-10-CM

## 2015-01-26 HISTORY — PX: CHOLECYSTECTOMY: SHX55

## 2015-01-26 HISTORY — DX: Acute embolism and thrombosis of unspecified deep veins of left lower extremity: I82.402

## 2015-01-26 SURGERY — LAPAROSCOPIC CHOLECYSTECTOMY
Anesthesia: General | Wound class: Clean Contaminated

## 2015-01-26 MED ORDER — LACTATED RINGERS IV SOLN
INTRAVENOUS | Status: DC
  Administered 2015-01-26 (×2): via INTRAVENOUS

## 2015-01-26 MED ORDER — GLYCOPYRROLATE 0.2 MG/ML IJ SOLN
INTRAMUSCULAR | Status: DC | PRN
  Administered 2015-01-26: 0.2 mg via INTRAVENOUS

## 2015-01-26 MED ORDER — SUGAMMADEX SODIUM 200 MG/2ML IV SOLN
INTRAVENOUS | Status: DC | PRN
  Administered 2015-01-26: 180 mg via INTRAVENOUS

## 2015-01-26 MED ORDER — FENTANYL CITRATE (PF) 100 MCG/2ML IJ SOLN
INTRAMUSCULAR | Status: AC
  Filled 2015-01-26: qty 2

## 2015-01-26 MED ORDER — FAMOTIDINE 20 MG PO TABS
ORAL_TABLET | ORAL | Status: AC
  Administered 2015-01-26: 20 mg via ORAL
  Filled 2015-01-26: qty 1

## 2015-01-26 MED ORDER — FENTANYL CITRATE (PF) 100 MCG/2ML IJ SOLN
INTRAMUSCULAR | Status: DC | PRN
  Administered 2015-01-26 (×5): 50 ug via INTRAVENOUS

## 2015-01-26 MED ORDER — LIDOCAINE HCL (CARDIAC) 20 MG/ML IV SOLN
INTRAVENOUS | Status: DC | PRN
  Administered 2015-01-26: 90 mg via INTRAVENOUS

## 2015-01-26 MED ORDER — SODIUM CHLORIDE 0.9 % IV SOLN
INTRAVENOUS | Status: DC | PRN
  Administered 2015-01-26: 200 mL via INTRAMUSCULAR

## 2015-01-26 MED ORDER — MIDAZOLAM HCL 2 MG/2ML IJ SOLN
INTRAMUSCULAR | Status: DC | PRN
  Administered 2015-01-26: 2 mg via INTRAVENOUS

## 2015-01-26 MED ORDER — ONDANSETRON HCL 4 MG/2ML IJ SOLN: 4.0000 mg | Freq: Once | INTRAMUSCULAR | Status: DC | PRN

## 2015-01-26 MED ORDER — HEPARIN SODIUM (PORCINE) 5000 UNIT/ML IJ SOLN
INTRAMUSCULAR | Status: AC
  Filled 2015-01-26: qty 1

## 2015-01-26 MED ORDER — SUCCINYLCHOLINE CHLORIDE 20 MG/ML IJ SOLN
INTRAMUSCULAR | Status: DC | PRN
  Administered 2015-01-26: 100 mg via INTRAVENOUS

## 2015-01-26 MED ORDER — ONDANSETRON HCL 4 MG/2ML IJ SOLN
INTRAMUSCULAR | Status: DC | PRN
  Administered 2015-01-26: 4 mg via INTRAVENOUS

## 2015-01-26 MED ORDER — FAMOTIDINE 20 MG PO TABS
20.0000 mg | ORAL_TABLET | Freq: Once | ORAL | Status: AC
  Administered 2015-01-26: 20 mg via ORAL

## 2015-01-26 MED ORDER — EPHEDRINE SULFATE 50 MG/ML IJ SOLN
INTRAMUSCULAR | Status: DC | PRN
  Administered 2015-01-26 (×2): 10 mg via INTRAVENOUS

## 2015-01-26 MED ORDER — OXYCODONE-ACETAMINOPHEN 5-325 MG PO TABS
ORAL_TABLET | ORAL | Status: DC
Start: 2015-01-26 — End: 2015-01-26
  Filled 2015-01-26: qty 1

## 2015-01-26 MED ORDER — PROPOFOL 10 MG/ML IV BOLUS
INTRAVENOUS | Status: DC | PRN
  Administered 2015-01-26: 150 mg via INTRAVENOUS

## 2015-01-26 MED ORDER — DEXAMETHASONE SODIUM PHOSPHATE 4 MG/ML IJ SOLN
INTRAMUSCULAR | Status: DC | PRN
  Administered 2015-01-26: 10 mg via INTRAVENOUS

## 2015-01-26 MED ORDER — ROCURONIUM BROMIDE 100 MG/10ML IV SOLN
INTRAVENOUS | Status: DC | PRN
  Administered 2015-01-26: 20 mg via INTRAVENOUS
  Administered 2015-01-26: 30 mg via INTRAVENOUS
  Administered 2015-01-26: 10 mg via INTRAVENOUS

## 2015-01-26 MED ORDER — OXYCODONE-ACETAMINOPHEN 5-325 MG PO TABS
1.0000 | ORAL_TABLET | ORAL | Status: DC | PRN
  Administered 2015-01-26: 1 via ORAL

## 2015-01-26 MED ORDER — SODIUM CHLORIDE 0.9 % IV SOLN
INTRAVENOUS | Status: DC | PRN
  Administered 2015-01-26: 15 mL

## 2015-01-26 MED ORDER — FENTANYL CITRATE (PF) 100 MCG/2ML IJ SOLN
25.0000 ug | INTRAMUSCULAR | Status: AC | PRN
  Administered 2015-01-26 (×6): 25 ug via INTRAVENOUS

## 2015-01-26 SURGICAL SUPPLY — 34 items
APPLIER CLIP ROT 10 11.4 M/L (STAPLE) ×2
CANISTER SUCT 1200ML W/VALVE (MISCELLANEOUS) ×2 IMPLANT
CANNULA DILATOR 10 W/SLV (CANNULA) ×2 IMPLANT
CATH REDDICK CHOLANGI 4FR 50CM (CATHETERS) ×2 IMPLANT
CHLORAPREP W/TINT 26ML (MISCELLANEOUS) ×2 IMPLANT
CLIP APPLIE ROT 10 11.4 M/L (STAPLE) ×1 IMPLANT
DRAPE SHEET LG 3/4 BI-LAMINATE (DRAPES) IMPLANT
GAUZE SPONGE 4X4 12PLY STRL (GAUZE/BANDAGES/DRESSINGS) ×2 IMPLANT
GLOVE BIO SURGEON STRL SZ7.5 (GLOVE) ×2 IMPLANT
GOWN STRL REUS W/ TWL LRG LVL3 (GOWN DISPOSABLE) ×3 IMPLANT
GOWN STRL REUS W/TWL LRG LVL3 (GOWN DISPOSABLE) ×3
IRRIGATION STRYKERFLOW (MISCELLANEOUS) ×1 IMPLANT
IRRIGATOR STRYKERFLOW (MISCELLANEOUS) ×2
IV NS 1000ML (IV SOLUTION) ×1
IV NS 1000ML BAXH (IV SOLUTION) ×1 IMPLANT
KIT RM TURNOVER STRD PROC AR (KITS) ×2 IMPLANT
LABEL OR SOLS (LABEL) ×2 IMPLANT
NDL INSUFF ACCESS 14 VERSASTEP (NEEDLE) ×2 IMPLANT
NEEDLE FILTER BLUNT 18X 1/2SAF (NEEDLE) ×1
NEEDLE FILTER BLUNT 18X1 1/2 (NEEDLE) ×1 IMPLANT
NS IRRIG 500ML POUR BTL (IV SOLUTION) ×2 IMPLANT
PACK LAP CHOLECYSTECTOMY (MISCELLANEOUS) ×2 IMPLANT
PAD GROUND ADULT SPLIT (MISCELLANEOUS) ×2 IMPLANT
SCISSORS METZENBAUM CVD 33 (INSTRUMENTS) ×2 IMPLANT
SEAL FOR SCOPE WARMER C3101 (MISCELLANEOUS) ×2 IMPLANT
SLEEVE ENDOPATH XCEL 5M (ENDOMECHANICALS) ×2 IMPLANT
STRIP CLOSURE SKIN 1/2X4 (GAUZE/BANDAGES/DRESSINGS) ×2 IMPLANT
SUT CHROMIC 5 0 RB 1 27 (SUTURE) ×2 IMPLANT
SUT VIC AB 0 CT2 27 (SUTURE) IMPLANT
SYR 3ML LL SCALE MARK (SYRINGE) ×2 IMPLANT
TROCAR XCEL NON-BLD 11X100MML (ENDOMECHANICALS) ×2 IMPLANT
TROCAR XCEL NON-BLD 5MMX100MML (ENDOMECHANICALS) ×2 IMPLANT
TUBING INSUFFLATOR HI FLOW (MISCELLANEOUS) ×2 IMPLANT
WATER STERILE IRR 1000ML POUR (IV SOLUTION) ×2 IMPLANT

## 2015-01-26 NOTE — Transfer of Care (Signed)
Immediate Anesthesia Transfer of Care Note  Patient: Karl Wise  Procedure(s) Performed: Procedure(s): LAPAROSCOPIC CHOLECYSTECTOMY (N/A)  Patient Location: PACU  Anesthesia Type:General  Level of Consciousness: awake and patient cooperative  Airway & Oxygen Therapy: Patient Spontanous Breathing and Patient connected to face mask oxygen  Post-op Assessment: Report given to RN  Post vital signs: Reviewed and stable  Last Vitals:  Filed Vitals:   01/26/15 1621 01/26/15 1625  BP:  146/81  Pulse:  66  Temp: 37.8 C 37.8F  Resp:  16    Complications: No apparent anesthesia complications

## 2015-01-26 NOTE — Anesthesia Procedure Notes (Signed)
Procedure Name: Intubation Date/Time: 01/26/2015 2:20 PM Performed by: Leander Rams Pre-anesthesia Checklist: Patient identified, Emergency Drugs available, Suction available, Patient being monitored and Timeout performed Patient Re-evaluated:Patient Re-evaluated prior to inductionOxygen Delivery Method: Circle system utilized Preoxygenation: Pre-oxygenation with 100% oxygen Intubation Type: IV induction Ventilation: Mask ventilation without difficulty Laryngoscope Size: Mac and 3 Grade View: Grade III Tube type: Oral Number of attempts: 1 Airway Equipment and Method: Stylet Placement Confirmation: ETT inserted through vocal cords under direct vision Secured at: 23 cm Dental Injury: Teeth and Oropharynx as per pre-operative assessment  Difficulty Due To: Difficulty was anticipated Future Recommendations: Recommend- induction with short-acting agent, and alternative techniques readily available

## 2015-01-26 NOTE — Op Note (Signed)
OPERATIVE REPORT  PREOPERATIVE DIAGNOSIS:  Chronic cholecystitis cholelithiasis  POSTOPERATIVE DIAGNOSIS: Chronic cholecystitis cholelithiasis  PROCEDURE: Laparoscopic cholecystectomy with cholangiogram  ANESTHESIA: General  SURGEON: Rochel Brome M.D.  INDICATIONS: He is a history of epigastric pain and ultrasound findings of a gallstone  With the patient on the operating table in the supine position under general endotracheal anesthesia the abdomen was prepared with ChloraPrep solution and draped in a sterile manner. A short incision was made in the inferior aspect of the umbilicus and carried down to the deep fascia which was grasped with a laryngeal hook. A Veress needle was inserted aspirated and irrigated with a saline solution. The peritoneal cavity was insufflated with carbon dioxide. The Veress needle was removed. The 10 mm cannula was inserted. The 10 mm 0 laparoscope was inserted to view the peritoneal cavity. The liver appeared smooth the location of gallbladder was demonstrated. Another incision was made in the epigastrium slightly to the right of the midline to introduce an 11 mm cannula. 2 incisions were made in the lateral aspect of the right upper quadrant to introduce 2   5 mm cannulas.   The gallbladder was retracted towards the right shoulder.   The gallbladder neck was retracted inferiorly and laterally.  The porta hepatis was identified. The gallbladder was mobilized with incision of the visceral peritoneum. The cystic duct was dissected free from surrounding structures. The cystic artery was dissected free from surrounding structures. A critical view of safety was demonstrated  An Endo Clip was placed across the cystic duct adjacent to the gallbladder neck. An incision was made in the cystic duct to introduce a Reddick catheter. The cholangiogram was done with injection of half-strength Conray 60 dye. This demonstrated the bile ducts and flow of dye into the duodenum. No  retained stones were identified. The cholangiogram appeared normal. The Reddick catheter was removed. The cystic duct was doubly ligated with endoclips and divided. The cystic artery was controlled with double endoclips and divided. The gallbladder was dissected free from the liver with use of hook and cautery and blunt dissection. Bleeding was minimal and hemostasis was intact. The gallbladder was delivered up through the infraumbilical incision opened and suctioned.. The stone was large in size and was grasped with stone forceps but would not come up through the fascial defect. The skin incision was enlarged by about 8 mm in the fascial incision and large by 8 mm this allowed removal of the gallbladder with stone which was submitted for pathology. A 10 mm cannula was reinserted and the laparoscope was brought back to the umbilical port the operative site was examined and was irrigated and aspirated. Hemostasis was intact. The cannulas were removed seeing no bleeding from the cannula sites. The fascial defect at the umbilicus was closed with a 0 Maxon figure-of-eight suture. The skin incisions were closed with interrupted 5-0 chromic subcutaneous suture benzoin and Steri-Strips. Gauze dressings were applied with paper tape.  The patient appeared to be in satisfactory condition and was prepared for transfer to the recovery room  [place].D.

## 2015-01-26 NOTE — Discharge Instructions (Signed)
Take Tylenol or oxycodone if needed for pain.  Remove dressings on Saturday.  May shower Sunday.  Steri-Strips will likely stay for 1 or 2 weeks.

## 2015-01-26 NOTE — Anesthesia Preprocedure Evaluation (Signed)
Anesthesia Evaluation  Patient identified by MRN, date of birth, ID band Patient awake    Reviewed: Allergy & Precautions, NPO status , Patient's Chart, lab work & pertinent test results  History of Anesthesia Complications Negative for: history of anesthetic complications  Airway Mallampati: III       Dental  (+) Chipped   Pulmonary neg pulmonary ROS, former smoker,           Cardiovascular hypertension, Pt. on medications and Pt. on home beta blockers + Peripheral Vascular Disease       Neuro/Psych negative neurological ROS     GI/Hepatic Neg liver ROS, GERD  Medicated and Controlled,  Endo/Other  negative endocrine ROS  Renal/GU Renal InsufficiencyRenal disease (stones)     Musculoskeletal  (+) Arthritis , Osteoarthritis,    Abdominal   Peds  Hematology  (+) anemia ,   Anesthesia Other Findings   Reproductive/Obstetrics                             Anesthesia Physical Anesthesia Plan  ASA: II  Anesthesia Plan: General   Post-op Pain Management:    Induction: Intravenous  Airway Management Planned: Oral ETT  Additional Equipment:   Intra-op Plan:   Post-operative Plan:   Informed Consent: I have reviewed the patients History and Physical, chart, labs and discussed the procedure including the risks, benefits and alternatives for the proposed anesthesia with the patient or authorized representative who has indicated his/her understanding and acceptance.     Plan Discussed with:   Anesthesia Plan Comments:         Anesthesia Quick Evaluation

## 2015-01-26 NOTE — OR Nursing (Signed)
Dr. Tamala Julian stopped by to see patient and brother in law.  Ok'd to dc home.

## 2015-01-26 NOTE — H&P (Signed)
  He reports minimal epigastric discomfort since the day of the office visit. He reports no change in overall condition. Office notes and lab work were reviewed  I discussed the plan for laparoscopic cholecystectomy

## 2015-01-29 ENCOUNTER — Encounter: Payer: Self-pay | Admitting: Surgery

## 2015-01-30 ENCOUNTER — Telehealth: Payer: Self-pay | Admitting: *Deleted

## 2015-01-30 LAB — SURGICAL PATHOLOGY

## 2015-01-30 MED ORDER — OXYCODONE HCL 5 MG PO TABS: ORAL_TABLET | ORAL | Status: DC

## 2015-01-30 NOTE — Telephone Encounter (Signed)
Informed that prescription is ready to pick up  

## 2015-01-30 NOTE — Anesthesia Postprocedure Evaluation (Signed)
Anesthesia Post Note  Patient: Karl Wise  Procedure(s) Performed: Procedure(s) (LRB): LAPAROSCOPIC CHOLECYSTECTOMY (N/A)  Patient location during evaluation: PACU Anesthesia Type: General Level of consciousness: awake Pain management: satisfactory to patient Vital Signs Assessment: post-procedure vital signs reviewed and stable Respiratory status: respiratory function stable Cardiovascular status: stable Anesthetic complications: no    Last Vitals:  Filed Vitals:   01/26/15 1738 01/26/15 1818  BP: 155/84 151/76  Pulse: 52 55  Temp: 36.8 C   Resp: 18 16    Last Pain:  Filed Vitals:   01/26/15 1822  PainSc: 2                  VAN STAVEREN,Allenmichael Mcpartlin

## 2015-02-08 ENCOUNTER — Other Ambulatory Visit: Payer: Self-pay | Admitting: Oncology

## 2015-02-12 ENCOUNTER — Telehealth: Payer: Self-pay | Admitting: *Deleted

## 2015-02-12 MED ORDER — HYDROCHLOROTHIAZIDE 25 MG PO TABS: 25.0000 mg | ORAL_TABLET | Freq: Every day | ORAL | Status: DC

## 2015-02-12 NOTE — Telephone Encounter (Signed)
Escribed

## 2015-02-15 ENCOUNTER — Ambulatory Visit: Payer: 59 | Admitting: Cardiothoracic Surgery

## 2015-02-22 ENCOUNTER — Ambulatory Visit: Payer: 59 | Admitting: Cardiothoracic Surgery

## 2015-03-08 ENCOUNTER — Other Ambulatory Visit: Payer: Self-pay | Admitting: Oncology

## 2015-03-22 ENCOUNTER — Ambulatory Visit: Payer: BLUE CROSS/BLUE SHIELD | Admitting: Cardiothoracic Surgery

## 2015-04-02 ENCOUNTER — Other Ambulatory Visit: Payer: Self-pay | Admitting: *Deleted

## 2015-04-02 MED ORDER — NEBIVOLOL HCL 5 MG PO TABS: 5.0000 mg | ORAL_TABLET | Freq: Every day | ORAL | Status: DC

## 2015-04-03 ENCOUNTER — Ambulatory Visit
Admission: RE | Admit: 2015-04-03 | Discharge: 2015-04-03 | Disposition: A | Discharge status: Home / Self Care | Payer: BLUE CROSS/BLUE SHIELD | Source: Ambulatory Visit | Attending: Oncology | Admitting: Oncology

## 2015-04-03 DIAGNOSIS — C3410 Malignant neoplasm of upper lobe, unspecified bronchus or lung: Secondary | ICD-10-CM | POA: Diagnosis not present

## 2015-04-03 DIAGNOSIS — Z0189 Encounter for other specified special examinations: Secondary | ICD-10-CM | POA: Diagnosis present

## 2015-04-03 DIAGNOSIS — R1011 Right upper quadrant pain: Secondary | ICD-10-CM | POA: Insufficient documentation

## 2015-04-03 LAB — GLUCOSE, CAPILLARY: GLUCOSE-CAPILLARY: 85 mg/dL (ref 65–99)

## 2015-04-03 MED ORDER — FLUDEOXYGLUCOSE F - 18 (FDG) INJECTION
11.7700 | Freq: Once | INTRAVENOUS | Status: AC | PRN
  Administered 2015-04-03: 11.77 via INTRAVENOUS

## 2015-04-10 ENCOUNTER — Other Ambulatory Visit: Payer: BLUE CROSS/BLUE SHIELD

## 2015-04-10 ENCOUNTER — Ambulatory Visit: Payer: BLUE CROSS/BLUE SHIELD | Admitting: Oncology

## 2015-04-12 ENCOUNTER — Inpatient Hospital Stay: Payer: BLUE CROSS/BLUE SHIELD | Attending: Oncology

## 2015-04-12 ENCOUNTER — Encounter: Payer: Self-pay | Admitting: Oncology

## 2015-04-12 ENCOUNTER — Inpatient Hospital Stay (HOSPITAL_BASED_OUTPATIENT_CLINIC_OR_DEPARTMENT_OTHER): Payer: BLUE CROSS/BLUE SHIELD | Admitting: Oncology

## 2015-04-12 VITALS — BP 159/96 | HR 58 | Temp 97.3°F | Resp 18 | Wt 198.9 lb

## 2015-04-12 DIAGNOSIS — R1011 Right upper quadrant pain: Secondary | ICD-10-CM | POA: Insufficient documentation

## 2015-04-12 DIAGNOSIS — Z87442 Personal history of urinary calculi: Secondary | ICD-10-CM | POA: Diagnosis not present

## 2015-04-12 DIAGNOSIS — Z9221 Personal history of antineoplastic chemotherapy: Secondary | ICD-10-CM

## 2015-04-12 DIAGNOSIS — M129 Arthropathy, unspecified: Secondary | ICD-10-CM

## 2015-04-12 DIAGNOSIS — I10 Essential (primary) hypertension: Secondary | ICD-10-CM

## 2015-04-12 DIAGNOSIS — Z87891 Personal history of nicotine dependence: Secondary | ICD-10-CM | POA: Insufficient documentation

## 2015-04-12 DIAGNOSIS — C3410 Malignant neoplasm of upper lobe, unspecified bronchus or lung: Secondary | ICD-10-CM

## 2015-04-12 DIAGNOSIS — Z86718 Personal history of other venous thrombosis and embolism: Secondary | ICD-10-CM | POA: Insufficient documentation

## 2015-04-12 DIAGNOSIS — K219 Gastro-esophageal reflux disease without esophagitis: Secondary | ICD-10-CM | POA: Diagnosis not present

## 2015-04-12 DIAGNOSIS — Z85118 Personal history of other malignant neoplasm of bronchus and lung: Secondary | ICD-10-CM

## 2015-04-12 DIAGNOSIS — N289 Disorder of kidney and ureter, unspecified: Secondary | ICD-10-CM | POA: Diagnosis not present

## 2015-04-12 DIAGNOSIS — D649 Anemia, unspecified: Secondary | ICD-10-CM | POA: Diagnosis not present

## 2015-04-12 LAB — CBC WITH DIFFERENTIAL/PLATELET
BASOS PCT: 1 %
Basophils Absolute: 0.1 10*3/uL (ref 0–0.1)
Eosinophils Absolute: 0.2 10*3/uL (ref 0–0.7)
Eosinophils Relative: 3 %
HCT: 40.6 % (ref 40.0–52.0)
Hemoglobin: 14.2 g/dL (ref 13.0–18.0)
LYMPHS ABS: 1.3 10*3/uL (ref 1.0–3.6)
Lymphocytes Relative: 20 %
MCH: 34.5 pg — AB (ref 26.0–34.0)
MCHC: 34.9 g/dL (ref 32.0–36.0)
MCV: 99 fL (ref 80.0–100.0)
MONO ABS: 0.5 10*3/uL (ref 0.2–1.0)
MONOS PCT: 8 %
NEUTROS ABS: 4.4 10*3/uL (ref 1.4–6.5)
Neutrophils Relative %: 68 %
PLATELETS: 181 10*3/uL (ref 150–440)
RBC: 4.1 MIL/uL — ABNORMAL LOW (ref 4.40–5.90)
RDW: 13.7 % (ref 11.5–14.5)
WBC: 6.4 10*3/uL (ref 3.8–10.6)

## 2015-04-12 LAB — COMPREHENSIVE METABOLIC PANEL
ALT: 19 U/L (ref 17–63)
AST: 25 U/L (ref 15–41)
Albumin: 4.5 g/dL (ref 3.5–5.0)
Alkaline Phosphatase: 65 U/L (ref 38–126)
Anion gap: 7 (ref 5–15)
BUN: 32 mg/dL — ABNORMAL HIGH (ref 6–20)
CHLORIDE: 102 mmol/L (ref 101–111)
CO2: 27 mmol/L (ref 22–32)
CREATININE: 2.06 mg/dL — AB (ref 0.61–1.24)
Calcium: 9.2 mg/dL (ref 8.9–10.3)
GFR, EST AFRICAN AMERICAN: 42 mL/min — AB (ref >60)
GFR, EST NON AFRICAN AMERICAN: 36 mL/min — AB (ref >60)
Glucose, Bld: 106 mg/dL — ABNORMAL HIGH (ref 65–99)
POTASSIUM: 3.9 mmol/L (ref 3.5–5.1)
Sodium: 136 mmol/L (ref 135–145)
TOTAL PROTEIN: 7.7 g/dL (ref 6.5–8.1)
Total Bilirubin: 1 mg/dL (ref 0.3–1.2)

## 2015-04-12 NOTE — Progress Notes (Signed)
[place] @ [facility] [phone]  Fax:(336) [place] OB: March 31, 1965  MR#: [id number]  SVX#:[id number]  Patient Care Team: Dion Body, MD as PCP - General (Family Medicine) [name], MD (Unknown Physician Specialty) [name], MD as Referring Physician (Surgery)  CHIEF COMPLAINT:  Chief Complaint  Patient presents with  . 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  status post 3 cycles of chemotherapy with cis-platinum and gemcitabine (allergic to Taxol chemotherapy) 2.left lower extremity thrombosis.  On   xeralto (April, 2015) 4.has finished order 4 cyclesOf chemotherapy with cis-platinum and gemcitabine (may of 2015) 5.patient underwent resection of the right lung mass  Possibility of microscopic invasion into the mediastinum could not be ruled out.  (August, 2015) EGFR not mutated. ALK  no  REARRANGEMENT WAS FOUND  6.patient decided to stop radiation therapy because of side effect in October of 2015. Patient has been taken on xeralto July 2 016 (patient has been on xeralto for more than one year)     Lung cancer, right upper lobe   06/30/2013 Initial Diagnosis Lung cancer, right upper lobe    Oncology Flowsheet 09/05/2013 01/26/2015  dexamethasone (DECADRON) IJ - -  ondansetron (ZOFRAN) IV - -  promethazine (PHENERGAN) IV 6.19 mg -   50-year-old patient with a history of carcinoma of lung right upper lobe status post chemotherapy resection patient had refused radiation therapy INTERVAL HISTORY: 50 year old gentleman came today further follow-up regarding carcinoma of lung.  Patient had a repeat CT scan.  CT scan was done without contrast because of renal insufficiency. Patient complains of right upper quadrant pain describes as spasm-like pain intermittent.  Has  been going on for last 9-12 months not associated with any nausea vomiting diarrhea.  Not associated with diet Not radiating Patient is working full-time. Patient has a change in the job.  No cough no shortness of breath patient does not smoke.  Here for further follow-up had a PET scan done  REVIEW OF SYSTEMS:   GENERAL:  Feels good.  Active.  No fevers, sweats or weight loss. PERFORMANCE STATUS (ECOG):  0 HEENT:  No visual changes, runny nose, sore throat, mouth sores or tenderness. Lungs: No shortness of breath or cough.  No hemoptysis. Cardiac:  No chest pain, palpitations, orthopnea, or PND. GI:  No nausea, vomiting, diarrhea, constipation, melena or hematochezia. Intermittent right upper quadrant pain Right upper quadrant pain as described in history of present illness GU:  No urgency, frequency, dysuria, or hematuria. Musculoskeletal:  No back pain.  No joint pain.  No muscle tenderness. Extremities:  No pain or swelling. Skin:  No rashes or skin changes. Neuro:  No headache, numbness or weakness, balance or coordination issues. Endocrine:  No diabetes, thyroid issues, hot flashes or night sweats. Psych:  No mood changes, depression or anxiety. Pain:  No focal pain. Review of systems:  All other systems reviewed and found to be negative. As per HPI. Otherwise, a complete review of systems is negatve.  PAST MEDICAL HISTORY: Past Medical History  Diagnosis Date  . Lung cancer ([place])     T3,N0,M0, CT-guided needle BX's positive for poorly differentiated carcinoma  . GERD (gastroesophageal reflux disease)   . Anemia   . Hypertension   . History of blood transfusion   . Left leg DVT ([place])  S/P IVC filter placement 7/10, on Xarelto  . Arthritis   . Kidney stones     mild renal insufficiency  . Deep vein thrombosis (DVT) of left lower extremity ([place])     PAST SURGICAL HISTORY: Past Surgical History  Procedure Laterality Date  . Cyst removed off of right side of neck      . Inguinal hernia repair    . Port-a-cath placement    . Video bronchoscopy N/A 09/05/2013    Procedure: VIDEO BRONCHOSCOPY;  Surgeon: Grace Isaac, MD;  Location: [facility] OR;  Service: Thoracic;  Laterality: N/A;  . Video assisted thoracoscopy (vats)/wedge resection Right 09/05/2013    Procedure: VIDEO ASSISTED THORACOSCOPY (VATS); Right Thoracotomy; Right Upper Lobectomy with Node Dissection and Enblock Resection of Mediastinal Invasion and Placement of OnQ Pain Pump;  Surgeon: Grace Isaac, MD;  Location: [facility] OR;  Service: Thoracic;  Laterality: Right;  . Cholecystectomy N/A 01/26/2015    Procedure: LAPAROSCOPIC CHOLECYSTECTOMY;  Surgeon: [name], MD;  Location: [facility] ORS;  Service: General;  Laterality: N/A;    Significant History/PMH:   DVT:    GERD - Esophageal Reflux:    Kidney Stones:    Anemia:    Lung Cancer:    HTN:    rt upper lobectomy:    cyst removed off of right side of neck:    inguinal hernia repair:   Preventive Screening:  Has patient had any of the following test? Prostate Exam (1)   Last Prostate Exam: does not remember when last examined(1)   Smoking History: Smoking History quit in 2007(1)  PFSH: Comments: No family history of colorectal cancer, breast cancer, or ovarian cancer.  Social History: negative alcohol, negative tobacco  Additional Past Medical and Surgical History: gastroesophagelux disease   ADVANCED DIRECTIVES:  Patient does not have any living will or healthcare power of attorney.  Information was given .  Available resources had been discussed.  We will follow-up on subsequent appointments regarding this issue HEALTH MAINTENANCE: Social History  Substance Use Topics  . Smoking status: Former Smoker -- 1.00 packs/day for 20 years    Types: Cigarettes    Quit date: 03/02/2005  . Smokeless tobacco: Never Used  . Alcohol Use: Yes     Comment: ocassionally      Allergies  Allergen Reactions  . Emend  [Aprepitant] Anaphylaxis  . Paclitaxel Anaphylaxis      OBJECTIVE:  Filed Vitals:   04/12/15 1137  BP: 159/96  Pulse: 58  Temp: 97.3 F (36.3 C)  Resp: 18     Body mass index is 31.14 kg/(m^2).    ECOG FS:0 - Asymptomatic  PHYSICAL EXAM:  GENERAL:  Well developed, well nourished, sitting comfortably in the exam room in no acute distress. MENTAL STATUS:  Alert and oriented to person, place and time.  ENT:  Oropharynx clear without lesion.  Tongue normal. Mucous membranes moist.  RESPIRATORY:  Clear to auscultation without rales, wheezes or rhonchi. CARDIOVASCULAR:  Regular rate and rhythm without murmur, rub or gallop.  ABDOMEN:  Soft, non-tender, with active bowel sounds, and no hepatosplenomegaly.  No masses. Tenderness in the right upper quadrant liver is not palpable.  Spleen is not palpable. BACK:  No CVA tenderness.  No tenderness on percussion of the back or rib cage. SKIN:  No rashes, ulcers or lesions. EXTREMITIES: No edema, no skin discoloration or tenderness.  No palpable cords. LYMPH NODES: No palpable cervical, supraclavicular, axillary or inguinal adenopathy  NEUROLOGICAL: Unremarkable. PSYCH:  Appropriate.  LAB RESULTS:  Appointment on 04/12/2015  Component Date Value Ref Range Status  . WBC 04/12/2015 6.4  3.8 - 10.6 K/uL Final  . RBC 04/12/2015 4.10* 4.40 - 5.90 MIL/uL Final  . Hemoglobin 04/12/2015 14.2  13.0 - 18.0 g/dL Final  . HCT 04/12/2015 40.6  40.0 - 52.0 % Final  . MCV 04/12/2015 99.0  80.0 - 100.0 fL Final  . MCH 04/12/2015 34.5* 26.0 - 34.0 pg Final  . MCHC 04/12/2015 34.9  32.0 - 36.0 g/dL Final  . RDW 04/12/2015 13.7  11.5 - 14.5 % Final  . Platelets 04/12/2015 181  150 - 440 K/uL Final  . Neutrophils Relative % 04/12/2015 68   Final  . Neutro Abs 04/12/2015 4.4  1.4 - 6.5 K/uL Final  . Lymphocytes Relative 04/12/2015 20   Final  . Lymphs Abs 04/12/2015 1.3  1.0 - 3.6 K/uL Final  . Monocytes Relative 04/12/2015 8   Final  . Monocytes  Absolute 04/12/2015 0.5  0.2 - 1.0 K/uL Final  . Eosinophils Relative 04/12/2015 3   Final  . Eosinophils Absolute 04/12/2015 0.2  0 - 0.7 K/uL Final  . Basophils Relative 04/12/2015 1   Final  . Basophils Absolute 04/12/2015 0.1  0 - 0.1 K/uL Final  . Sodium 04/12/2015 136  135 - 145 mmol/L Final  . Potassium 04/12/2015 3.9  3.5 - 5.1 mmol/L Final  . Chloride 04/12/2015 102  101 - 111 mmol/L Final  . CO2 04/12/2015 27  22 - 32 mmol/L Final  . Glucose, Bld 04/12/2015 106* 65 - 99 mg/dL Final  . BUN 04/12/2015 32* 6 - 20 mg/dL Final  . Creatinine, Ser 04/12/2015 2.06* 0.61 - 1.24 mg/dL Final  . Calcium 04/12/2015 9.2  8.9 - 10.3 mg/dL Final  . Total Protein 04/12/2015 7.7  6.5 - 8.1 g/dL Final  . Albumin 04/12/2015 4.5  3.5 - 5.0 g/dL Final  . AST 04/12/2015 25  15 - 41 U/L Final  . ALT 04/12/2015 19  17 - 63 U/L Final  . Alkaline Phosphatase 04/12/2015 65  38 - 126 U/L Final  . Total Bilirubin 04/12/2015 1.0  0.3 - 1.2 mg/dL Final  . GFR calc non Af Amer 04/12/2015 36* >60 mL/min Final  . GFR calc Af Amer 04/12/2015 42* >60 mL/min Final   Comment: (NOTE) The eGFR has been calculated using the CKD EPI equation. This calculation has not been validated in all clinical situations. eGFR's persistently <60 mL/min signify possible Chronic Kidney Disease.   . Anion gap 04/12/2015 7  5 - 15 Final      ASSESSMENT: Carcinoma of lung status post resection after neoadjuvant chemotherapy Patient has refused radiation therapy  MEDICAL DECISION MAKING:  Scan has been reviewed independently.  And reviewed with the patient.  There is no evidence of recurrent or progressive disease Mild renal dysfunction stable patient being followed by nephrologist Does not smoke. Performance status remains stable Reevaluation in 4-6 months All lab data has been reviewed   Duration of visit is 25 minutes and 50% of time was spent discussing is alert of the PET scan as well as overall prognosis Lung cancer,  right upper lobe   Staging form: Lung, AJCC 7th Edition     Clinical: No stage assigned - Unsigned     Pathologic: Stage IIIA (T4, N0, cM0) - Signed by Grace Isaac, MD on 09/29/2013   [name], MD   04/12/2015 11:56 AM

## 2015-05-16 ENCOUNTER — Other Ambulatory Visit: Payer: Self-pay | Admitting: Oncology

## 2015-08-02 IMAGING — CR DG CHEST 1V PORT
1 series · 1 of 1 positions shown · non-contrast
Comparison: 09/06/2013

CLINICAL DATA: Status post resection of right upper lobe lung
carcinoma.

EXAM:
PORTABLE CHEST - 1 VIEW

[AP]
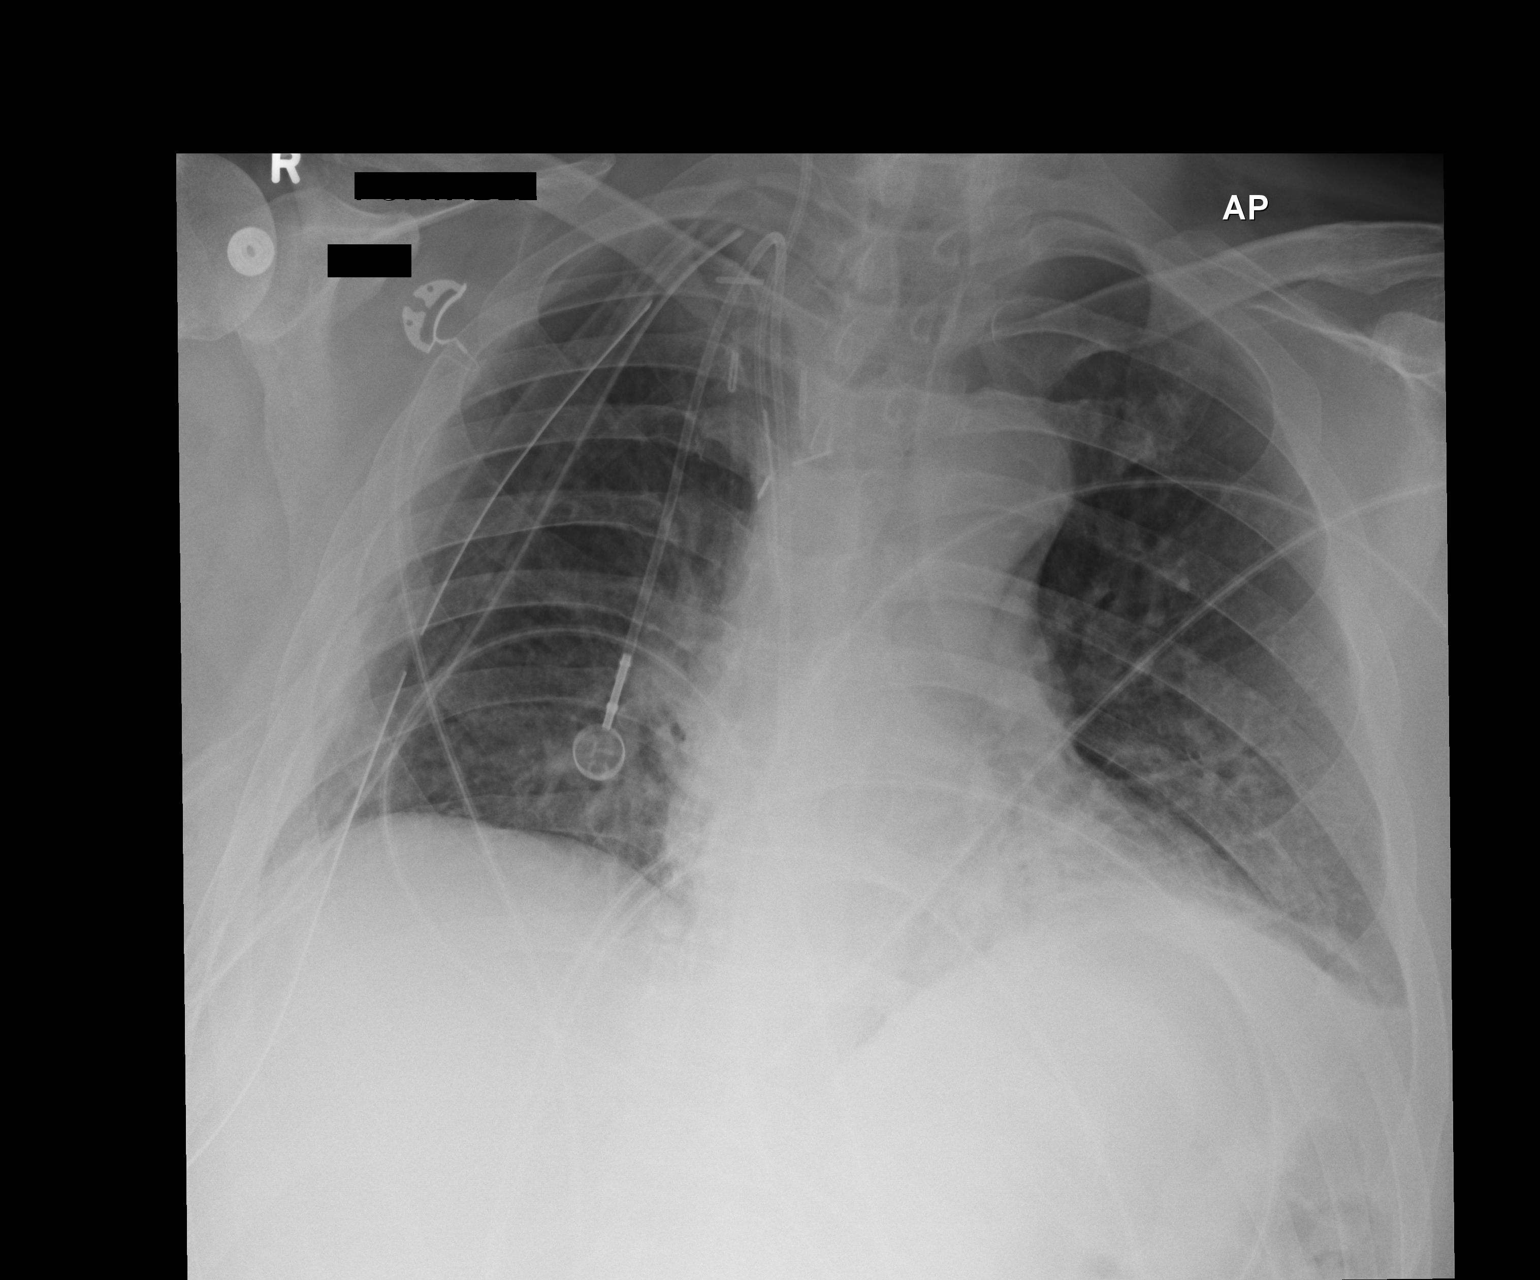

[1 of 1 positions shown; findings below may reference images not displayed]

FINDINGS: Right chest tubes remain present with no evidence of pneumothorax.
Central line and Port-A-Cath positioning is stable. There is mild
increase in left basilar atelectasis. No edema or pleural fluid
identified. The heart size and mediastinal contours are stable.
There may be a small amount of mediastinal air present adjacent to
the aortic knob in the region of the AP window.
IMPRESSION: No pneumothorax. Mild worsening of left basilar atelectasis.
Potential small amount of air in the mediastinum.

## 2015-10-10 ENCOUNTER — Other Ambulatory Visit: Payer: BLUE CROSS/BLUE SHIELD

## 2015-10-10 ENCOUNTER — Ambulatory Visit: Payer: BLUE CROSS/BLUE SHIELD | Admitting: Oncology

## 2015-10-23 NOTE — Progress Notes (Signed)
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  INTERVAL HISTORY: Patient returns to clinic today for routine six-month follow-up. He continues to feel well and remains asymptomatic. He has no neurologic complaints. He denies any recent fevers or illnesses. He has a good appetite and denies weight loss. He denies any pain. He has no nausea, vomiting, constipation, or diarrhea. He has no urinary complaints. Patient feels at his baseline and offers no specific complaints today.  REVIEW OF SYSTEMS:   Review of Systems  Constitutional: Negative.  Negative for fever, malaise/fatigue and weight loss.  Respiratory: Negative.  Negative for cough and shortness of breath.   Cardiovascular: Negative.  Negative for chest pain.  Gastrointestinal: Negative.  Negative for abdominal pain.  Genitourinary: Negative.   Musculoskeletal: Negative.   Neurological: Negative.  Negative for sensory change and weakness.  Psychiatric/Behavioral: Negative.  The patient is not nervous/anxious.     As per HPI. Otherwise, a complete review of systems is negatve.  PAST MEDICAL HISTORY: Past Medical History:  Diagnosis Date  . Anemia   . Arthritis   . Deep vein thrombosis (DVT) of left lower extremity ([place])   . GERD (gastroesophageal reflux disease)   . History of blood transfusion   . Hypertension   . Kidney stones    mild renal insufficiency  . Left leg DVT ([facility])    S/P IVC filter placement 7/10, on Xarelto  . Lung cancer ([place])    T3,N0,M0, CT-guided needle BX's positive for poorly differentiated carcinoma    PAST SURGICAL HISTORY: Past  Surgical History:  Procedure Laterality Date  . CHOLECYSTECTOMY N/A 01/26/2015   Procedure: LAPAROSCOPIC CHOLECYSTECTOMY;  Surgeon: [name], MD;  Location: [facility] ORS;  Service: General;  Laterality: N/A;  . cyst removed off of right side of neck    . INGUINAL HERNIA REPAIR    . port-a-cath placement    . VIDEO ASSISTED THORACOSCOPY (VATS)/WEDGE RESECTION Right 09/05/2013   Procedure: VIDEO ASSISTED THORACOSCOPY (VATS); Right Thoracotomy; Right Upper Lobectomy with Node Dissection and Enblock Resection of Mediastinal Invasion and Placement of OnQ Pain Pump;  Surgeon: Grace Isaac, MD;  Location: [place];  Service: Thoracic;  Laterality: Right;  Marland Kitchen VIDEO BRONCHOSCOPY N/A 09/05/2013   Procedure: VIDEO BRONCHOSCOPY;  Surgeon: Grace Isaac, MD;  Location: [facility] OR;  Service: Thoracic;  Laterality: N/A;    FAMILY HISTORY: Family History  Problem Relation Age of Onset  . Diabetes type II Mother   . Hypertension Mother   . Diabetes type II Father   . Hypertension Father        ADVANCED DIRECTIVES (Y/N):  N   HEALTH MAINTENANCE: Social History  Substance Use Topics  . Smoking status: Former Smoker    Packs/day: 1.00    Years: 20.00    Types: Cigarettes    Quit date: 03/02/2005  . Smokeless tobacco: Never Used  . Alcohol use Yes     Comment: ocassionally     Colonoscopy:  PAP:  Bone density:  Lipid panel:  Allergies  Allergen Reactions  . Emend [Aprepitant] Anaphylaxis  . Paclitaxel Anaphylaxis    Current Outpatient Prescriptions  Medication Sig Dispense Refill  . Fexofenadine HCl (  ALLEGRA PO) Take 1 tablet by mouth daily as needed (allergies).    . hydrochlorothiazide (HYDRODIURIL) 25 MG tablet Take 1 tablet (25 mg total) by mouth daily. 90 tablet 0  . nebivolol (BYSTOLIC) 5 MG tablet Take 1 tablet (5 mg total) by mouth daily. 90 tablet 3   No current facility-administered medications for this visit.     OBJECTIVE: Vitals:   10/24/15 1438  BP: (!)  157/100  Pulse: 73  Resp: 18  Temp: 97.2 F (36.2 C)     Body mass index is 32.18 kg/m.    ECOG FS:0 - Asymptomatic  General: Well-developed, well-nourished, no acute distress. Eyes: Pink conjunctiva, anicteric sclera. HEENT: Normocephalic, moist mucous membranes, clear oropharnyx. Lungs: Clear to auscultation bilaterally. Heart: Regular rate and rhythm. No rubs, murmurs, or gallops. Abdomen: Soft, nontender, nondistended. No organomegaly noted, normoactive bowel sounds. Musculoskeletal: No edema, cyanosis, or clubbing. Neuro: Alert, answering all questions appropriately. Cranial nerves grossly intact. Skin: No rashes or petechiae noted. Psych: Normal affect. Lymphatics: No cervical, calvicular, axillary or inguinal LAD.   LAB RESULTS:  Lab Results  Component Value Date   NA 136 10/24/2015   K 3.7 10/24/2015   CL 103 10/24/2015   CO2 26 10/24/2015   GLUCOSE 119 (H) 10/24/2015   BUN 36 (H) 10/24/2015   CREATININE 2.12 (H) 10/24/2015   CALCIUM 9.4 10/24/2015   PROT 7.9 10/24/2015   ALBUMIN 4.6 10/24/2015   AST 25 10/24/2015   ALT 20 10/24/2015   ALKPHOS 63 10/24/2015   BILITOT 1.1 10/24/2015   GFRNONAA 35 (L) 10/24/2015   GFRAA 40 (L) 10/24/2015    Lab Results  Component Value Date   WBC 9.6 10/24/2015   NEUTROABS 7.6 (H) 10/24/2015   HGB 14.6 10/24/2015   HCT 41.2 10/24/2015   MCV 99.6 10/24/2015   PLT 189 10/24/2015     STUDIES: No results found.  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  PLAN:    1. 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 He subsequently underwent resection in August 2015.he did not complete adjuvant XRT and discontinued in October 2015. His most recent CT scan revealed no evidence of recurrence or progression. He will require  CT scan in the next 1-2 weeks. Return to clinic in 6 months for further evaluation. 2. Renal insufficiency: Patient's creatinine appears to be at his baseline. Continue monitoring by nephrology.   Patient expressed understanding and was in agreement with this plan. He also understands that He can call clinic at any time with any questions, concerns, or complaints.   Lung cancer, right upper lobe   Staging form: Lung, AJCC 7th Edition   - Clinical: No stage assigned - Unsigned   - Pathologic: Stage IIIA (T4, N0, cM0) - Signed by Grace Isaac, MD on 09/29/2013  Lloyd Huger, MD   10/29/2015 8:59 AM

## 2015-10-24 ENCOUNTER — Inpatient Hospital Stay: Payer: BLUE CROSS/BLUE SHIELD

## 2015-10-24 ENCOUNTER — Inpatient Hospital Stay: Payer: BLUE CROSS/BLUE SHIELD | Attending: Oncology | Admitting: Oncology

## 2015-10-24 VITALS — BP 157/100 | HR 73 | Temp 97.2°F | Resp 18 | Wt 205.5 lb

## 2015-10-24 DIAGNOSIS — C3410 Malignant neoplasm of upper lobe, unspecified bronchus or lung: Secondary | ICD-10-CM

## 2015-10-24 DIAGNOSIS — D649 Anemia, unspecified: Secondary | ICD-10-CM | POA: Diagnosis not present

## 2015-10-24 DIAGNOSIS — Z9221 Personal history of antineoplastic chemotherapy: Secondary | ICD-10-CM | POA: Insufficient documentation

## 2015-10-24 DIAGNOSIS — Z79899 Other long term (current) drug therapy: Secondary | ICD-10-CM | POA: Insufficient documentation

## 2015-10-24 DIAGNOSIS — I10 Essential (primary) hypertension: Secondary | ICD-10-CM | POA: Insufficient documentation

## 2015-10-24 DIAGNOSIS — Z85118 Personal history of other malignant neoplasm of bronchus and lung: Secondary | ICD-10-CM | POA: Diagnosis not present

## 2015-10-24 DIAGNOSIS — Z86718 Personal history of other venous thrombosis and embolism: Secondary | ICD-10-CM | POA: Insufficient documentation

## 2015-10-24 DIAGNOSIS — Z87891 Personal history of nicotine dependence: Secondary | ICD-10-CM | POA: Insufficient documentation

## 2015-10-24 DIAGNOSIS — C3411 Malignant neoplasm of upper lobe, right bronchus or lung: Secondary | ICD-10-CM

## 2015-10-24 DIAGNOSIS — K219 Gastro-esophageal reflux disease without esophagitis: Secondary | ICD-10-CM | POA: Diagnosis not present

## 2015-10-24 DIAGNOSIS — Z87442 Personal history of urinary calculi: Secondary | ICD-10-CM | POA: Insufficient documentation

## 2015-10-24 DIAGNOSIS — M129 Arthropathy, unspecified: Secondary | ICD-10-CM | POA: Diagnosis not present

## 2015-10-24 DIAGNOSIS — N2889 Other specified disorders of kidney and ureter: Secondary | ICD-10-CM | POA: Insufficient documentation

## 2015-10-24 LAB — CBC WITH DIFFERENTIAL/PLATELET
BASOS ABS: 0.1 10*3/uL (ref 0–0.1)
Basophils Relative: 1 %
Eosinophils Absolute: 0.1 10*3/uL (ref 0–0.7)
Eosinophils Relative: 1 %
HEMATOCRIT: 41.2 % (ref 40.0–52.0)
HEMOGLOBIN: 14.6 g/dL (ref 13.0–18.0)
LYMPHS PCT: 13 %
Lymphs Abs: 1.3 10*3/uL (ref 1.0–3.6)
MCH: 35.3 pg — ABNORMAL HIGH (ref 26.0–34.0)
MCHC: 35.4 g/dL (ref 32.0–36.0)
MCV: 99.6 fL (ref 80.0–100.0)
Monocytes Absolute: 0.6 10*3/uL (ref 0.2–1.0)
Monocytes Relative: 6 %
Neutro Abs: 7.6 10*3/uL — ABNORMAL HIGH (ref 1.4–6.5)
Neutrophils Relative %: 79 %
Platelets: 189 10*3/uL (ref 150–440)
RBC: 4.14 MIL/uL — ABNORMAL LOW (ref 4.40–5.90)
RDW: 13.1 % (ref 11.5–14.5)
WBC: 9.6 10*3/uL (ref 3.8–10.6)

## 2015-10-24 LAB — COMPREHENSIVE METABOLIC PANEL
ALBUMIN: 4.6 g/dL (ref 3.5–5.0)
ALK PHOS: 63 U/L (ref 38–126)
ALT: 20 U/L (ref 17–63)
AST: 25 U/L (ref 15–41)
Anion gap: 7 (ref 5–15)
BILIRUBIN TOTAL: 1.1 mg/dL (ref 0.3–1.2)
BUN: 36 mg/dL — AB (ref 6–20)
CO2: 26 mmol/L (ref 22–32)
Calcium: 9.4 mg/dL (ref 8.9–10.3)
Chloride: 103 mmol/L (ref 101–111)
Creatinine, Ser: 2.12 mg/dL — ABNORMAL HIGH (ref 0.61–1.24)
GFR calc Af Amer: 40 mL/min — ABNORMAL LOW (ref >60)
GFR calc non Af Amer: 35 mL/min — ABNORMAL LOW (ref >60)
GLUCOSE: 119 mg/dL — AB (ref 65–99)
POTASSIUM: 3.7 mmol/L (ref 3.5–5.1)
Sodium: 136 mmol/L (ref 135–145)
TOTAL PROTEIN: 7.9 g/dL (ref 6.5–8.1)

## 2015-10-24 NOTE — Progress Notes (Signed)
States is feeling well. Offers no complaints. 

## 2015-10-31 ENCOUNTER — Other Ambulatory Visit: Payer: Self-pay | Admitting: *Deleted

## 2015-10-31 DIAGNOSIS — Z85118 Personal history of other malignant neoplasm of bronchus and lung: Secondary | ICD-10-CM

## 2015-11-01 ENCOUNTER — Ambulatory Visit
Admission: RE | Admit: 2015-11-01 | Discharge: 2015-11-01 | Disposition: A | Discharge status: Home / Self Care | Payer: BLUE CROSS/BLUE SHIELD | Source: Ambulatory Visit | Attending: Oncology | Admitting: Oncology

## 2015-11-01 ENCOUNTER — Other Ambulatory Visit: Payer: Self-pay

## 2015-11-01 ENCOUNTER — Inpatient Hospital Stay: Payer: BLUE CROSS/BLUE SHIELD | Attending: Oncology

## 2015-11-01 DIAGNOSIS — Z85118 Personal history of other malignant neoplasm of bronchus and lung: Secondary | ICD-10-CM | POA: Insufficient documentation

## 2015-11-01 DIAGNOSIS — C3411 Malignant neoplasm of upper lobe, right bronchus or lung: Secondary | ICD-10-CM

## 2015-11-01 LAB — CREATININE, SERUM
Creatinine, Ser: 2.22 mg/dL — ABNORMAL HIGH (ref 0.61–1.24)
GFR calc Af Amer: 38 mL/min — ABNORMAL LOW (ref >60)
GFR calc non Af Amer: 33 mL/min — ABNORMAL LOW (ref >60)

## 2016-04-22 NOTE — Progress Notes (Signed)
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  INTERVAL HISTORY: Patient returns to clinic today for routine six-month follow-up. He continues to feel well and remains asymptomatic. He has no neurologic complaints. He denies any recent fevers or illnesses. He denies any chest pain, cough, hemoptysis, or shortness of breath.  He has a good appetite and denies weight loss. He denies any pain. He has no nausea, vomiting, constipation, or diarrhea. He has no urinary complaints. Patient feels at his baseline and offers no specific complaints today.  REVIEW OF SYSTEMS:   Review of Systems  Constitutional: Negative.  Negative for fever, malaise/fatigue and weight loss.  Respiratory: Negative.  Negative for cough, hemoptysis and shortness of breath.   Cardiovascular: Negative.  Negative for chest pain and leg swelling.  Gastrointestinal: Negative.  Negative for abdominal pain.  Genitourinary: Negative.   Musculoskeletal: Negative.   Neurological: Negative.  Negative for sensory change and weakness.  Psychiatric/Behavioral: Negative.  The patient is not nervous/anxious.     As per HPI. Otherwise, a complete review of systems is negative.  PAST MEDICAL HISTORY: Past Medical History:  Diagnosis Date  . Anemia   . Arthritis   . Deep vein thrombosis (DVT) of left lower extremity ([place])   . GERD (gastroesophageal reflux disease)   . History of blood transfusion   . Hypertension   . Kidney stones    mild renal insufficiency  . Left leg DVT (H[facility])    S/P IVC filter placement 7/10, on Xarelto  . Lung cancer ([place])    T3,N0,M0,  CT-guided needle BX's positive for poorly differentiated carcinoma    PAST SURGICAL HISTORY: Past Surgical History:  Procedure Laterality Date  . CHOLECYSTECTOMY N/A 01/26/2015   Procedure: LAPAROSCOPIC CHOLECYSTECTOMY;  Surgeon: [name], MD;  Location: [facility] ORS;  Service: General;  Laterality: N/A;  . cyst removed off of right side of neck    . INGUINAL HERNIA REPAIR    . port-a-cath placement    . VIDEO ASSISTED THORACOSCOPY (VATS)/WEDGE RESECTION Right 09/05/2013   Procedure: VIDEO ASSISTED THORACOSCOPY (VATS); Right Thoracotomy; Right Upper Lobectomy with Node Dissection and Enblock Resection of Mediastinal Invasion and Placement of OnQ Pain Pump;  Surgeon: Grace Isaac, MD;  Location: [place];  Service: Thoracic;  Laterality: Right;  Marland Kitchen VIDEO BRONCHOSCOPY N/A 09/05/2013   Procedure: VIDEO BRONCHOSCOPY;  Surgeon: Grace Isaac, MD;  Location: [facility] OR;  Service: Thoracic;  Laterality: N/A;    FAMILY HISTORY: Family History  Problem Relation Age of Onset  . Diabetes type II Mother   . Hypertension Mother   . Diabetes type II Father   . Hypertension Father        ADVANCED DIRECTIVES (Y/N):  N   HEALTH MAINTENANCE: Social History  Substance Use Topics  . Smoking status: Former Smoker    Packs/day: 1.00    Years: 20.00    Types: Cigarettes    Quit date: 03/02/2005  . Smokeless tobacco: Never Used  . Alcohol use Yes     Comment: ocassionally     Colonoscopy:  PAP:  Bone density:  Lipid panel:  Allergies  Allergen Reactions  . Emend [Aprepitant] Anaphylaxis  . Paclitaxel  Anaphylaxis    Current Outpatient Prescriptions  Medication Sig Dispense Refill  . Fexofenadine HCl (ALLEGRA PO) Take 1 tablet by mouth daily as needed (allergies).    . hydrochlorothiazide (HYDRODIURIL) 25 MG tablet Take 1 tablet (25 mg total) by mouth daily. 90 tablet 0  . nebivolol (BYSTOLIC) 5 MG tablet Take 1 tablet (5 mg total) by mouth daily. 90 tablet 3   No current  facility-administered medications for this visit.     OBJECTIVE: Vitals:   04/23/16 1511  BP: (!) 147/92  Pulse: 72  Temp: 97.4 F (36.3 C)     Body mass index is 32.8 kg/m.    ECOG FS:0 - Asymptomatic  General: Well-developed, well-nourished, no acute distress. Eyes: Pink conjunctiva, anicteric sclera. Lungs: Clear to auscultation bilaterally. Heart: Regular rate and rhythm. No rubs, murmurs, or gallops. Abdomen: Soft, nontender, nondistended. No organomegaly noted, normoactive bowel sounds. Musculoskeletal: No edema, cyanosis, or clubbing. Neuro: Alert, answering all questions appropriately. Cranial nerves grossly intact. Skin: No rashes or petechiae noted. Psych: Normal affect.   LAB RESULTS:  Lab Results  Component Value Date   NA 136 10/24/2015   K 3.7 10/24/2015   CL 103 10/24/2015   CO2 26 10/24/2015   GLUCOSE 119 (H) 10/24/2015   BUN 36 (H) 10/24/2015   CREATININE 2.22 (H) 11/01/2015   CALCIUM 9.4 10/24/2015   PROT 7.9 10/24/2015   ALBUMIN 4.6 10/24/2015   AST 25 10/24/2015   ALT 20 10/24/2015   ALKPHOS 63 10/24/2015   BILITOT 1.1 10/24/2015   GFRNONAA 33 (L) 11/01/2015   GFRAA 38 (L) 11/01/2015    Lab Results  Component Value Date   WBC 9.6 10/24/2015   NEUTROABS 7.6 (H) 10/24/2015   HGB 14.6 10/24/2015   HCT 41.2 10/24/2015   MCV 99.6 10/24/2015   PLT 189 10/24/2015     STUDIES: No results found.  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  PLAN:    1. 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 He subsequently underwent resection in August 2015. He did not complete adjuvant XRT secondary to side effects and discontinued treatment in October 2015. His most recent CT scan in September 2017 reviewed independently revealed no evidence of  recurrence or progression. Patient will require a CT scan in approximately September 2018 which will be approximately 3 years removed from completing his treatments. He then can switch to yearly imaging. Return to clinic in 1-2 days after his next CT scan for further evaluation. 2. Renal insufficiency: Patient's creatinine appears to be at his baseline. Continue monitoring by nephrology. Patient will likely require noncontrast CTs.   Patient expressed understanding and was in agreement with this plan. He also understands that He can call clinic at any time with any questions, concerns, or complaints.   Cancer Staging Lung cancer, right upper lobe Staging form: Lung, AJCC 7th Edition - Clinical: No stage assigned - Unsigned - Pathologic: Stage IIIA (T4, N0, cM0) - Signed by Grace Isaac, MD on 09/29/2013   Lloyd Huger, MD   04/27/2016 8:52 AM

## 2016-04-23 ENCOUNTER — Inpatient Hospital Stay: Payer: BLUE CROSS/BLUE SHIELD | Attending: Oncology | Admitting: Oncology

## 2016-04-23 ENCOUNTER — Encounter: Payer: Self-pay | Admitting: Oncology

## 2016-04-23 VITALS — BP 147/92 | HR 72 | Temp 97.4°F | Wt 209.4 lb

## 2016-04-23 DIAGNOSIS — C3411 Malignant neoplasm of upper lobe, right bronchus or lung: Secondary | ICD-10-CM

## 2016-04-23 DIAGNOSIS — Z923 Personal history of irradiation: Secondary | ICD-10-CM | POA: Diagnosis not present

## 2016-04-23 DIAGNOSIS — Z79899 Other long term (current) drug therapy: Secondary | ICD-10-CM

## 2016-04-23 DIAGNOSIS — M129 Arthropathy, unspecified: Secondary | ICD-10-CM | POA: Diagnosis not present

## 2016-04-23 DIAGNOSIS — Z9049 Acquired absence of other specified parts of digestive tract: Secondary | ICD-10-CM | POA: Diagnosis not present

## 2016-04-23 DIAGNOSIS — K219 Gastro-esophageal reflux disease without esophagitis: Secondary | ICD-10-CM | POA: Diagnosis not present

## 2016-04-23 DIAGNOSIS — Z86718 Personal history of other venous thrombosis and embolism: Secondary | ICD-10-CM | POA: Diagnosis not present

## 2016-04-23 DIAGNOSIS — I129 Hypertensive chronic kidney disease with stage 1 through stage 4 chronic kidney disease, or unspecified chronic kidney disease: Secondary | ICD-10-CM

## 2016-04-23 DIAGNOSIS — D649 Anemia, unspecified: Secondary | ICD-10-CM | POA: Diagnosis not present

## 2016-04-23 DIAGNOSIS — Z9221 Personal history of antineoplastic chemotherapy: Secondary | ICD-10-CM

## 2016-04-23 DIAGNOSIS — Z87442 Personal history of urinary calculi: Secondary | ICD-10-CM | POA: Diagnosis not present

## 2016-04-23 DIAGNOSIS — Z85118 Personal history of other malignant neoplasm of bronchus and lung: Secondary | ICD-10-CM

## 2016-04-23 DIAGNOSIS — N189 Chronic kidney disease, unspecified: Secondary | ICD-10-CM

## 2016-04-23 DIAGNOSIS — Z87891 Personal history of nicotine dependence: Secondary | ICD-10-CM | POA: Insufficient documentation

## 2016-05-18 ENCOUNTER — Other Ambulatory Visit: Payer: Self-pay | Admitting: Oncology

## 2016-06-18 ENCOUNTER — Other Ambulatory Visit: Payer: Self-pay | Admitting: Oncology

## 2016-11-04 IMAGING — CT CT CHEST W/O CM
2 of 3 series · 15 of 36 positions shown, 18 images · non-contrast
Comparison: 06/08/2014

CLINICAL DATA: Lung cancer status post right upper lung resection,
chemotherapy, and radiation in 0549. Right lateral chest pain.

EXAM:
CT CHEST WITHOUT CONTRAST
TECHNIQUE: Multidetector CT imaging of the chest was performed following the
standard protocol without IV contrast.

[Series 2: routine chest wo · axial · 0.69mm/px · z∈[-598,-338]mm · 12 of 62 slices shown, 15 images]
[im 5/62  mediastinal]
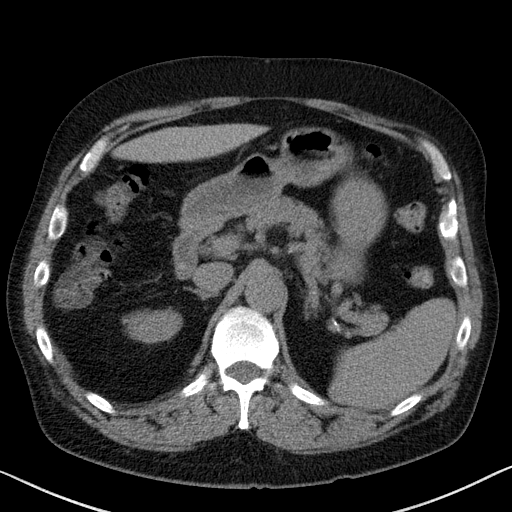
[im 5/62  lung]
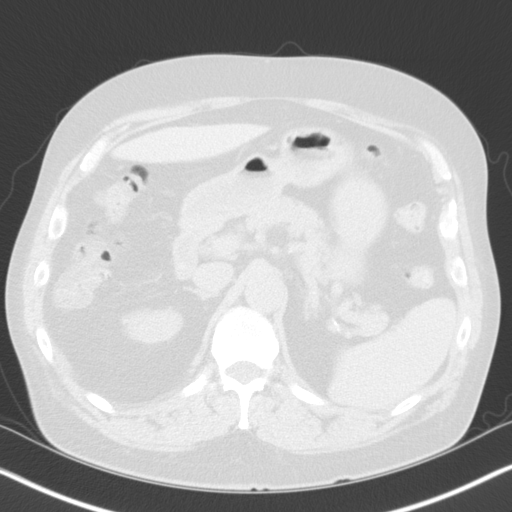
[im 10/62  lung]
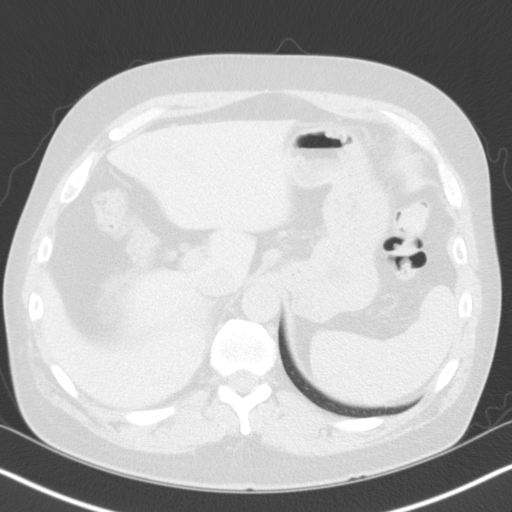
[im 14/62  lung]
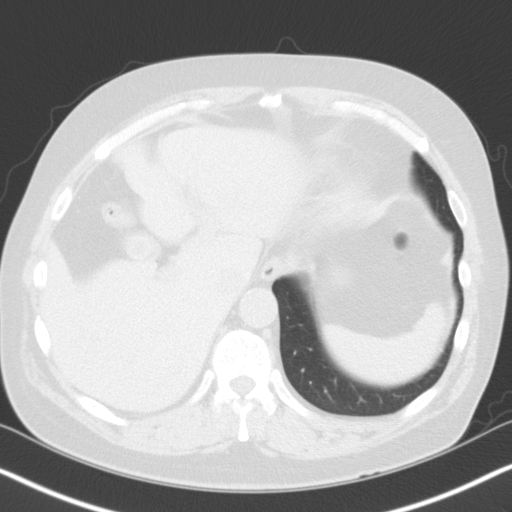
[im 19/62  lung]
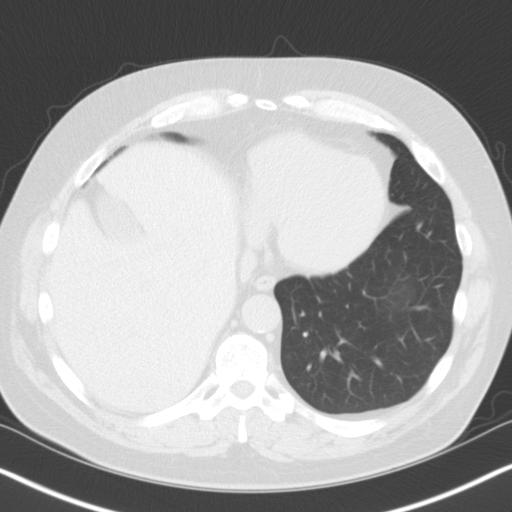
[im 23/62  mediastinal]
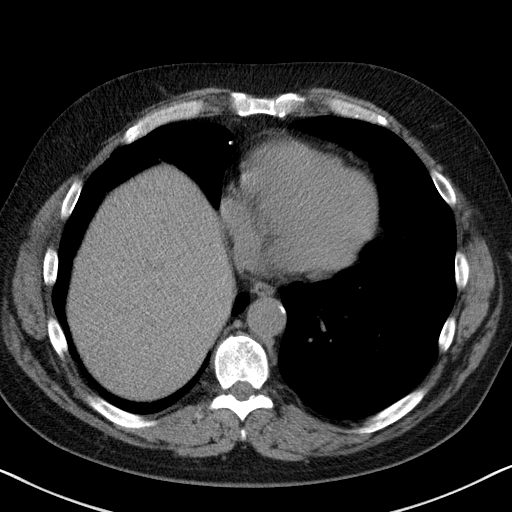
[im 23/62  lung]
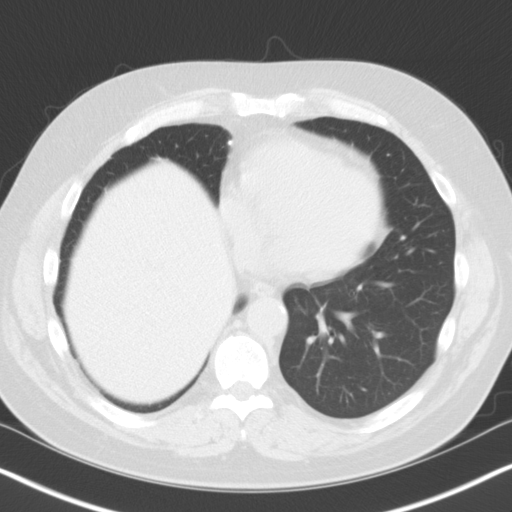
[im 28/62  lung]
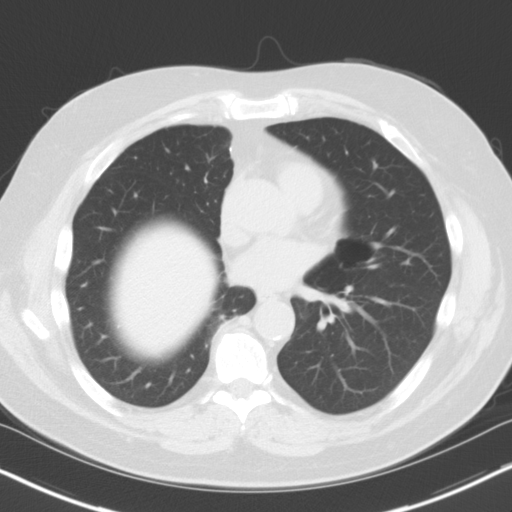
[im 34/62  lung]
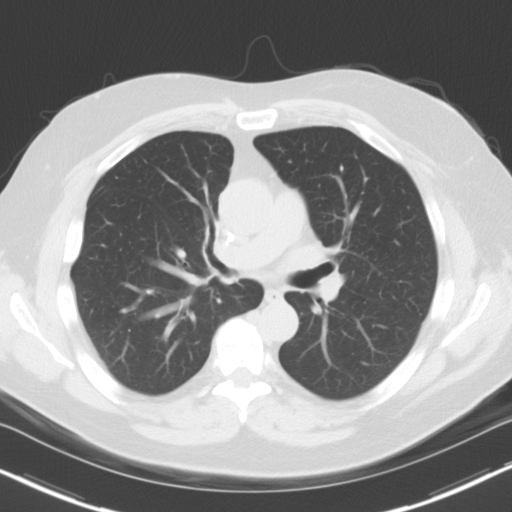
[im 39/62  lung]
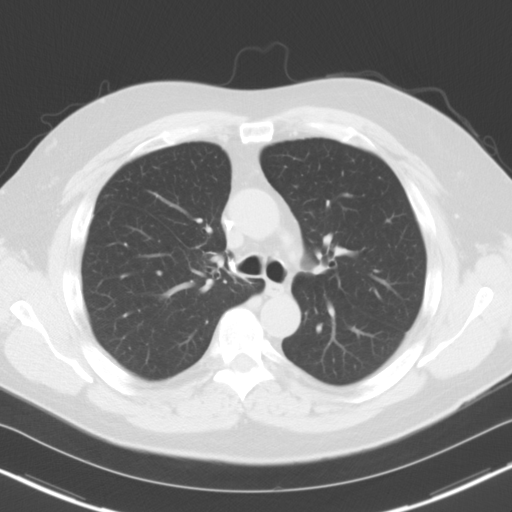
[im 43/62  mediastinal]
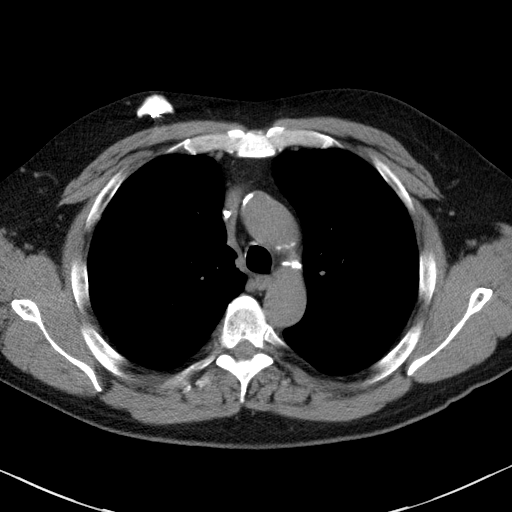
[im 43/62  lung]
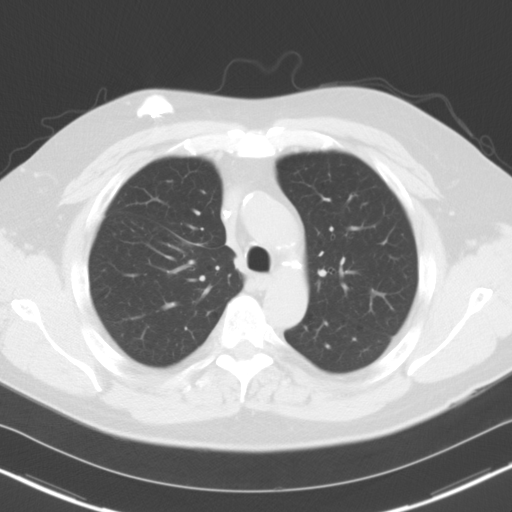
[im 48/62  lung]
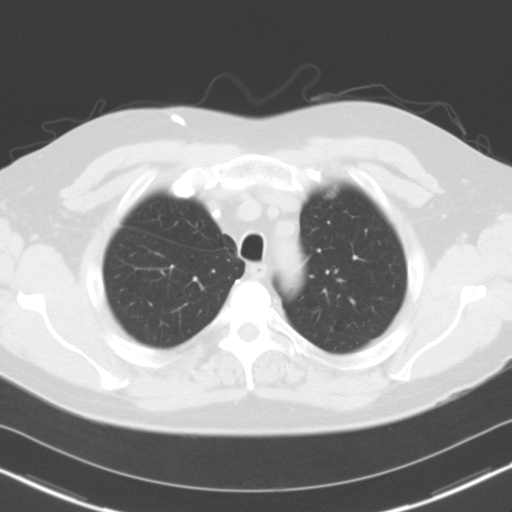
[im 52/62  lung]
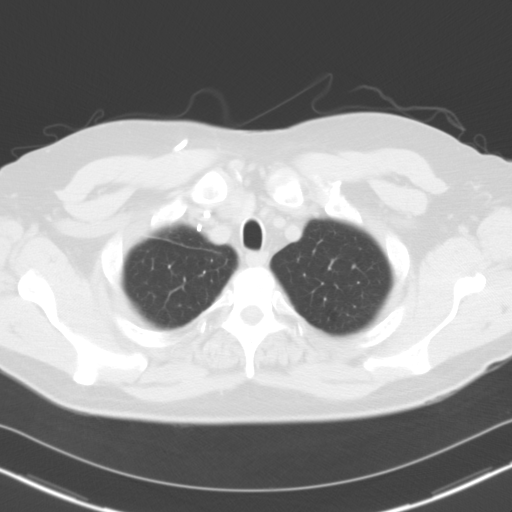
[im 57/62  lung]
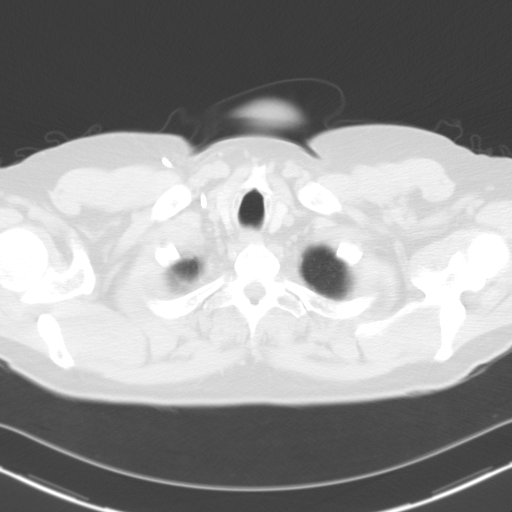

[Series 5: cor routine chest wo · coronal · 0.61mm/px · 3 of 140 slices shown]
[im 28/140  lung]
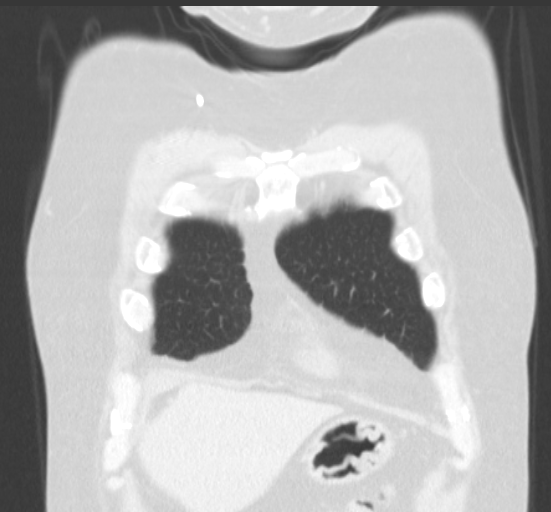
[im 56/140  lung]
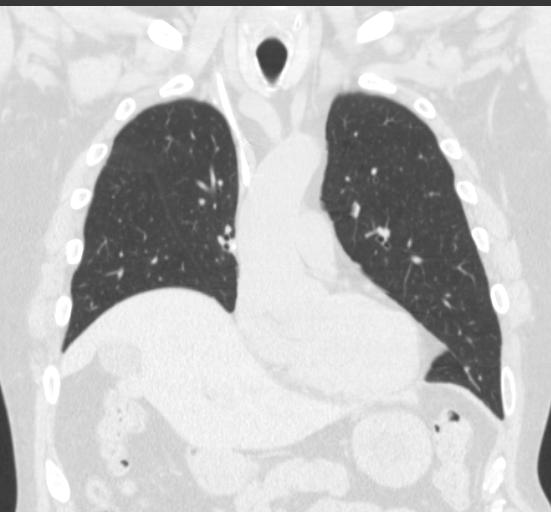
[im 84/140  lung]
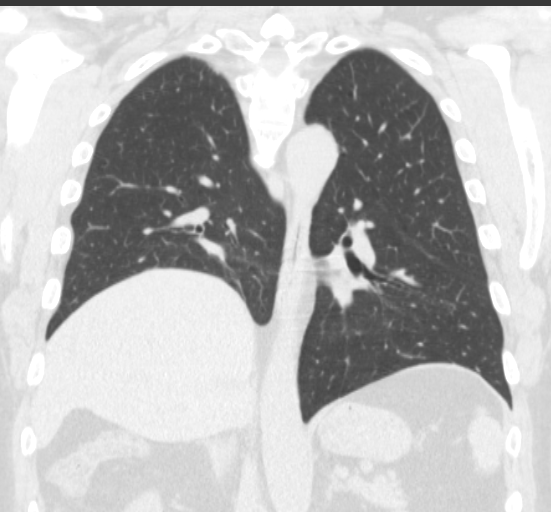

[15 of 36 positions shown; findings below may reference images not displayed]

FINDINGS: Mediastinum/Nodes: Heart is normal in size. No pericardial effusion.

Mild atherosclerotic calcifications of the aortic arch.

Right chest port terminates at the cavoatrial junction.

Visualized thyroid is unremarkable.

Lungs/Pleura: Status post right upper lobectomy.

Mild scarring in the lingula (series 4/image 46).

No suspicious pulmonary nodules.

No focal consolidation.

No pleural effusion or pneumothorax.

Upper abdomen: Visualized upper abdomen is notable for a 2.1 cm
gallstone.

Musculoskeletal: Degenerative changes of the visualized
thoracolumbar spine.
IMPRESSION: Status post right upper lobectomy.

No evidence of recurrent or metastatic disease.

## 2016-11-13 ENCOUNTER — Ambulatory Visit
Admission: RE | Admit: 2016-11-13 | Discharge: 2016-11-13 | Disposition: A | Discharge status: Home / Self Care | Payer: BLUE CROSS/BLUE SHIELD | Source: Ambulatory Visit | Attending: Oncology | Admitting: Oncology

## 2016-11-13 ENCOUNTER — Other Ambulatory Visit: Payer: Self-pay | Admitting: *Deleted

## 2016-11-13 DIAGNOSIS — J439 Emphysema, unspecified: Secondary | ICD-10-CM | POA: Diagnosis not present

## 2016-11-13 DIAGNOSIS — Z902 Acquired absence of lung [part of]: Secondary | ICD-10-CM | POA: Insufficient documentation

## 2016-11-13 DIAGNOSIS — I700 Atherosclerosis of aorta: Secondary | ICD-10-CM | POA: Insufficient documentation

## 2016-11-13 DIAGNOSIS — C3490 Malignant neoplasm of unspecified part of unspecified bronchus or lung: Secondary | ICD-10-CM

## 2016-11-13 DIAGNOSIS — C3411 Malignant neoplasm of upper lobe, right bronchus or lung: Secondary | ICD-10-CM

## 2016-11-13 LAB — POCT I-STAT CREATININE: CREATININE: 2.5 mg/dL — AB (ref 0.61–1.24)

## 2016-11-13 MED ORDER — IOPAMIDOL (ISOVUE-300) INJECTION 61%
75.0000 mL | Freq: Once | INTRAVENOUS | Status: AC | PRN
  Administered 2016-11-13: 75 mL via INTRAVENOUS

## 2016-11-16 NOTE — Progress Notes (Signed)
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  INTERVAL HISTORY: Patient returns to clinic today for routine follow-up and discussion of his imaging results. He continues to feel well and remains asymptomatic. He has no neurologic complaints. He denies any recent fevers or illnesses. He denies any chest pain, cough, hemoptysis, or shortness of breath.  He has a good appetite and denies weight loss. He denies any pain. He has no nausea, vomiting, constipation, or diarrhea. He has no urinary complaints. Patient feels at his baseline and offers no specific complaints today.  REVIEW OF SYSTEMS:   Review of Systems  Constitutional: Negative.  Negative for fever, malaise/fatigue and weight loss.  Respiratory: Negative.  Negative for cough, hemoptysis and shortness of breath.   Cardiovascular: Negative.  Negative for chest pain and leg swelling.  Gastrointestinal: Negative.  Negative for abdominal pain.  Genitourinary: Negative.   Musculoskeletal: Negative.   Skin: Negative.  Negative for rash.  Neurological: Negative.  Negative for sensory change and weakness.  Psychiatric/Behavioral: Negative.  The patient is not nervous/anxious.     As per HPI. Otherwise, a complete review of systems is negative.  PAST MEDICAL HISTORY: Past Medical History:  Diagnosis Date  . Anemia   . Arthritis   . Deep vein thrombosis (DVT) of left lower extremity ([place])   . GERD (gastroesophageal reflux disease)   . History of blood transfusion   . Hypertension   . Kidney stones    mild renal insufficiency  . Left leg DVT (H[facility])    S/P IVC  filter placement 7/10, on Xarelto  . Lung cancer ([place])    T3,N0,M0, CT-guided needle BX's positive for poorly differentiated carcinoma    PAST SURGICAL HISTORY: Past Surgical History:  Procedure Laterality Date  . CHOLECYSTECTOMY N/A 01/26/2015   Procedure: LAPAROSCOPIC CHOLECYSTECTOMY;  Surgeon: [name], MD;  Location: [facility] ORS;  Service: General;  Laterality: N/A;  . cyst removed off of right side of neck    . INGUINAL HERNIA REPAIR    . port-a-cath placement    . VIDEO ASSISTED THORACOSCOPY (VATS)/WEDGE RESECTION Right 09/05/2013   Procedure: VIDEO ASSISTED THORACOSCOPY (VATS); Right Thoracotomy; Right Upper Lobectomy with Node Dissection and Enblock Resection of Mediastinal Invasion and Placement of OnQ Pain Pump;  Surgeon: Grace Isaac, MD;  Location: [place];  Service: Thoracic;  Laterality: Right;  Marland Kitchen VIDEO BRONCHOSCOPY N/A 09/05/2013   Procedure: VIDEO BRONCHOSCOPY;  Surgeon: Grace Isaac, MD;  Location: [facility];  Service: Thoracic;  Laterality: N/A;    FAMILY HISTORY: Family History  Problem Relation Age of Onset  . Diabetes type II Mother   . Hypertension Mother   . Diabetes type II Father   . Hypertension Father        ADVANCED DIRECTIVES (Y/N):  N   HEALTH MAINTENANCE: Social History  Substance Use Topics  . Smoking status: Former Smoker    Packs/day: 1.00    Years: 20.00    Types: Cigarettes    Quit date: 03/02/2005  . Smokeless tobacco: Never Used  . Alcohol use Yes     Comment: ocassionally     Colonoscopy:  PAP:  Bone density:  Lipid panel:  Allergies  Allergen Reactions  . Emend [Aprepitant] Anaphylaxis  . Paclitaxel Anaphylaxis    Current Outpatient Prescriptions  Medication Sig Dispense Refill  . Fexofenadine HCl (ALLEGRA PO) Take 1 tablet by mouth daily as needed (allergies).    . losartan (COZAAR) 100 MG tablet Take 100 mg by mouth daily.     . nebivolol (BYSTOLIC) 5 MG tablet Take 1 tablet (5 mg total) by mouth daily. 90  tablet 3   No current facility-administered medications for this visit.     OBJECTIVE: Vitals:   11/17/16 1431  BP: (!) 138/93  Pulse: 75  Resp: 18  Temp: 98 F (36.7 C)  SpO2: 97%     Body mass index is 30.6 kg/m.    ECOG FS:0 - Asymptomatic  General: Well-developed, well-nourished, no acute distress. Eyes: Pink conjunctiva, anicteric sclera. Lungs: Clear to auscultation bilaterally. Heart: Regular rate and rhythm. No rubs, murmurs, or gallops. Abdomen: Soft, nontender, nondistended. No organomegaly noted, normoactive bowel sounds. Musculoskeletal: No edema, cyanosis, or clubbing. Neuro: Alert, answering all questions appropriately. Cranial nerves grossly intact. Skin: No rashes or petechiae noted. Psych: Normal affect.   LAB RESULTS:  Lab Results  Component Value Date   NA 136 10/24/2015   K 3.7 10/24/2015   CL 103 10/24/2015   CO2 26 10/24/2015   GLUCOSE 119 (H) 10/24/2015   BUN 36 (H) 10/24/2015   CREATININE 2.50 (H) 11/13/2016   CALCIUM 9.4 10/24/2015   PROT 7.9 10/24/2015   ALBUMIN 4.6 10/24/2015   AST 25 10/24/2015   ALT 20 10/24/2015   ALKPHOS 63 10/24/2015   BILITOT 1.1 10/24/2015   GFRNONAA 33 (L) 11/01/2015   GFRAA 38 (L) 11/01/2015    Lab Results  Component Value Date   WBC 9.6 10/24/2015   NEUTROABS 7.6 (H) 10/24/2015   HGB 14.6 10/24/2015   HCT 41.2 10/24/2015   MCV 99.6 10/24/2015   PLT 189 10/24/2015     STUDIES: Ct Chest Wo Contrast  Result Date: 11/13/2016 CLINICAL DATA:  Status post right upper lobectomy in 2015 for lung cancer. Asymptomatic. EXAM: CT CHEST WITHOUT CONTRAST TECHNIQUE: Multidetector CT imaging of the chest was performed following the standard protocol without IV contrast. COMPARISON:  11/01/2015 FINDINGS: Cardiovascular: A right-sided Port-A-Cath which terminates at the low SVC. Aortic and branch vessel atherosclerosis. Tortuous thoracic aorta. Borderline cardiomegaly. No pericardial effusion. Mediastinum/Nodes: No  supraclavicular adenopathy. No mediastinal or definite hilar adenopathy, given limitations of unenhanced CT. Lungs/Pleura: No pleural fluid. Status post right upper lobectomy. Mild centrilobular emphysema. Clear lungs. Upper Abdomen: Mild hepatic steatosis with more focal steatosis adjacent the falciform ligament. Normal imaged portions of the spleen, stomach, pancreas, adrenal glands, left kidney. Musculoskeletal: No acute osseous abnormality. Mild left hemidiaphragm elevation. IMPRESSION: 1. Status post right upper lobectomy. 2. No evidence of recurrent or metastatic disease. 3.  Emphysema (ICD10-J43.9). 4.  Aortic Atherosclerosis (ICD10-I70.0). Electronically Signed   By: Abigail Miyamoto M.D.   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  PLAN:    1. 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 He subsequently underwent resection in August 2015. He did not complete adjuvant XRT secondary to side effects and discontinued treatment in October 2015. His most recent CT scan on November 13, 2016 reviewed independently and reported as above with no evidence of  recurrent or metastatic disease. No intervention is needed at this time. Return to clinic in 1 year with repeat imaging and further evaluation. 2. Renal insufficiency: Patient's creatinine appears to be at his baseline. Continue monitoring by nephrology. Patient will require noncontrast CTs.   Patient expressed understanding and was in agreement with this plan. He also understands that He can call clinic at any time with any questions, concerns, or complaints.   Cancer Staging Lung cancer, right upper lobe Staging form: Lung, AJCC 7th Edition - Clinical: No stage assigned - Unsigned - Pathologic: Stage IIIA (T4, N0, cM0) -  Signed by Grace Isaac, MD on 09/29/2013   Karl Huger, MD   11/21/2016 10:25 AM

## 2016-11-17 ENCOUNTER — Inpatient Hospital Stay: Payer: BLUE CROSS/BLUE SHIELD | Attending: Oncology | Admitting: Oncology

## 2016-11-17 VITALS — BP 138/93 | HR 75 | Temp 98.0°F | Resp 18 | Wt 195.4 lb

## 2016-11-17 DIAGNOSIS — I700 Atherosclerosis of aorta: Secondary | ICD-10-CM | POA: Diagnosis not present

## 2016-11-17 DIAGNOSIS — Z86718 Personal history of other venous thrombosis and embolism: Secondary | ICD-10-CM | POA: Insufficient documentation

## 2016-11-17 DIAGNOSIS — D649 Anemia, unspecified: Secondary | ICD-10-CM | POA: Diagnosis not present

## 2016-11-17 DIAGNOSIS — C3411 Malignant neoplasm of upper lobe, right bronchus or lung: Secondary | ICD-10-CM

## 2016-11-17 DIAGNOSIS — I10 Essential (primary) hypertension: Secondary | ICD-10-CM | POA: Insufficient documentation

## 2016-11-17 DIAGNOSIS — Z79899 Other long term (current) drug therapy: Secondary | ICD-10-CM | POA: Insufficient documentation

## 2016-11-17 DIAGNOSIS — Z87891 Personal history of nicotine dependence: Secondary | ICD-10-CM | POA: Diagnosis not present

## 2016-11-17 DIAGNOSIS — N289 Disorder of kidney and ureter, unspecified: Secondary | ICD-10-CM | POA: Diagnosis not present

## 2016-11-17 DIAGNOSIS — J439 Emphysema, unspecified: Secondary | ICD-10-CM

## 2016-11-17 DIAGNOSIS — M129 Arthropathy, unspecified: Secondary | ICD-10-CM | POA: Diagnosis not present

## 2016-11-17 DIAGNOSIS — K760 Fatty (change of) liver, not elsewhere classified: Secondary | ICD-10-CM

## 2016-11-17 DIAGNOSIS — Z85118 Personal history of other malignant neoplasm of bronchus and lung: Secondary | ICD-10-CM | POA: Diagnosis not present

## 2016-11-17 DIAGNOSIS — K219 Gastro-esophageal reflux disease without esophagitis: Secondary | ICD-10-CM

## 2016-11-17 DIAGNOSIS — Z87442 Personal history of urinary calculi: Secondary | ICD-10-CM | POA: Diagnosis not present

## 2016-11-17 NOTE — Progress Notes (Signed)
Patient is here for follow up, he has no complaints

## 2016-11-19 ENCOUNTER — Other Ambulatory Visit: Payer: BLUE CROSS/BLUE SHIELD

## 2016-12-20 IMAGING — CR DG CHOLANGIOGRAM OPERATIVE
3 series · 14 of 22 positions shown · non-contrast
Comparison: Abdominal ultrasound 12/18/2014

CLINICAL DATA: 49-year-old male with cholelithiasis undergoing
cholecystectomy

EXAM:
INTRAOPERATIVE CHOLANGIOGRAM
TECHNIQUE: Cholangiographic images from the C-arm fluoroscopic device were
submitted for interpretation post-operatively. Please see the
procedural report for the amount of contrast and the fluoroscopy
time utilized.

[cont. (1 of 3)]
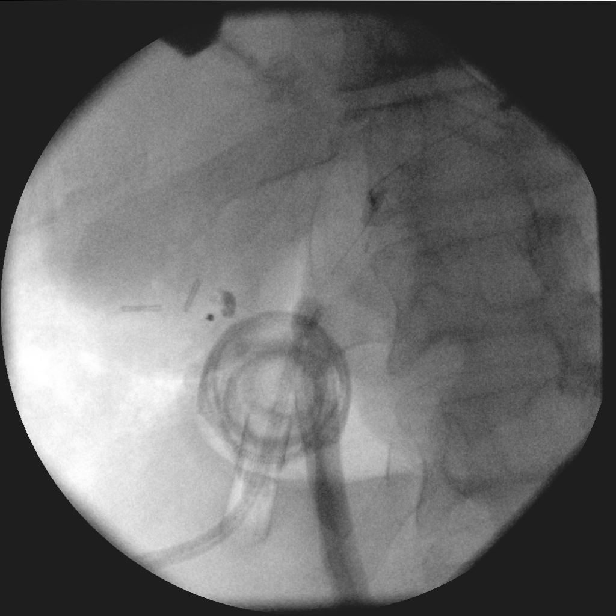

[Series 5: cont. · 7 of 13 frames shown (2 of 3)]
[frame 1/13]
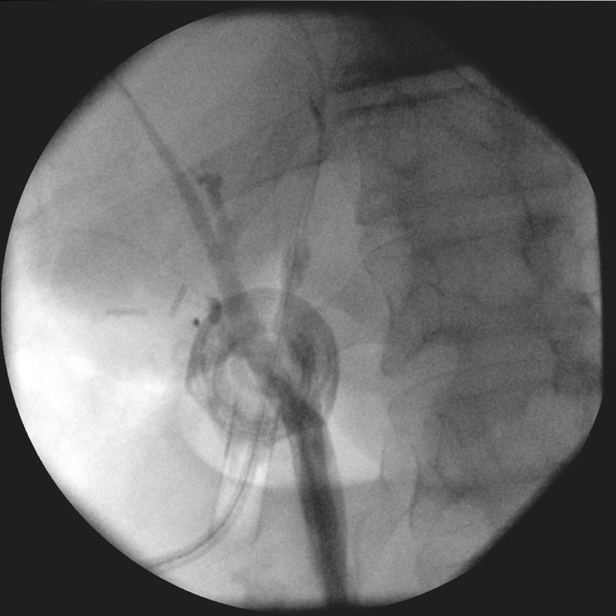
[frame 2/13]
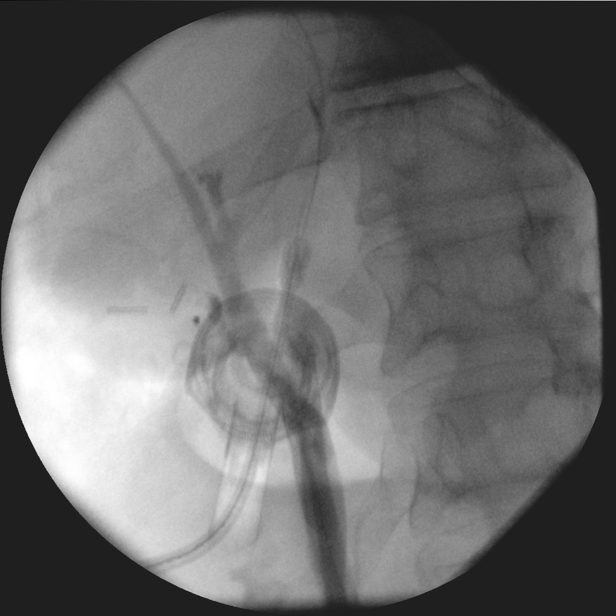
[frame 5/13]
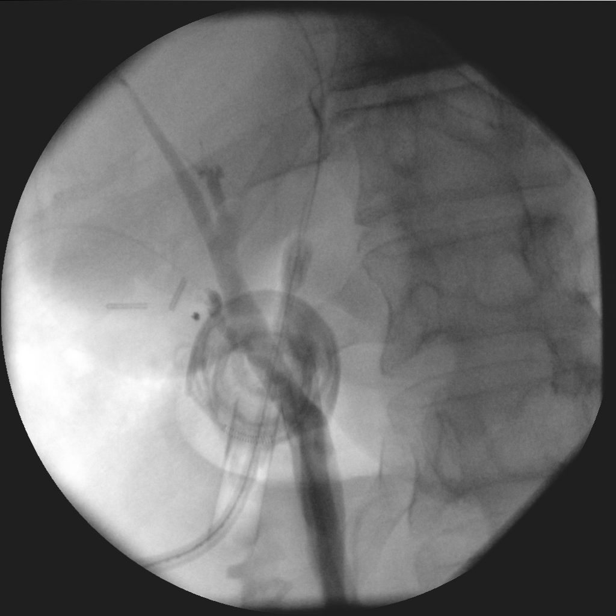
[frame 7/13]
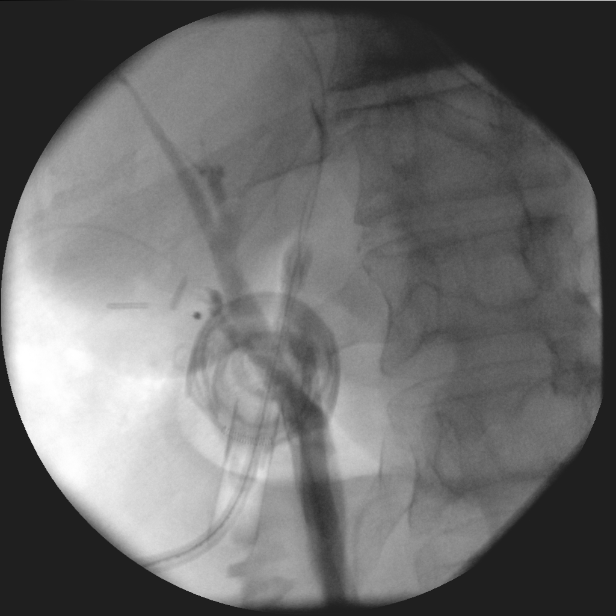
[frame 9/13]
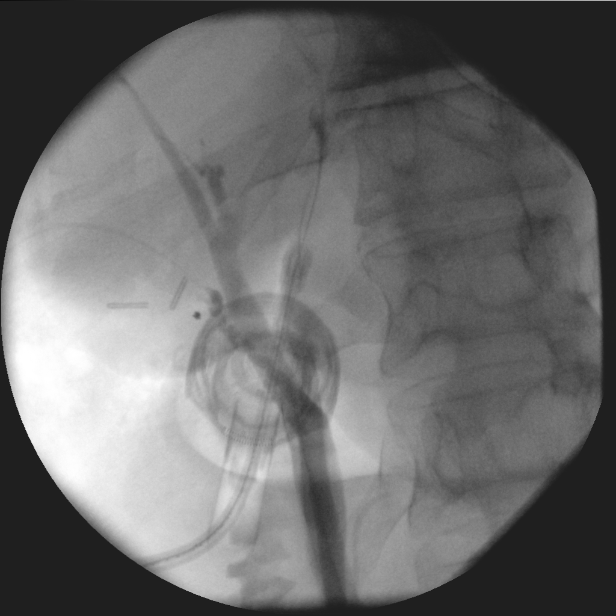
[frame 11/13]
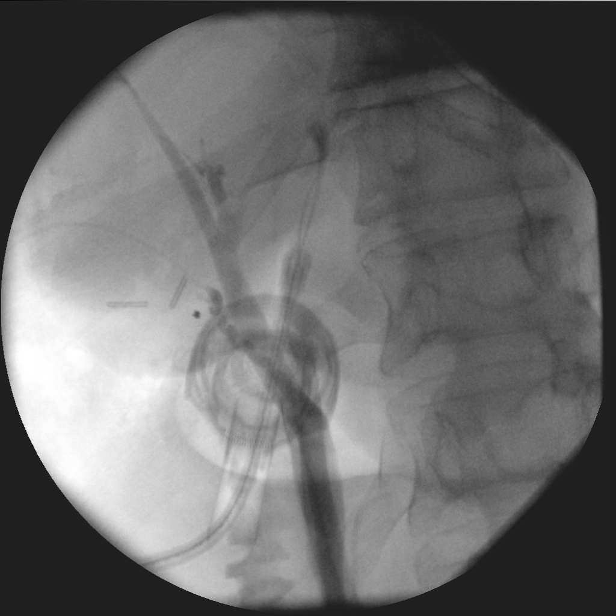
[frame 13/13]
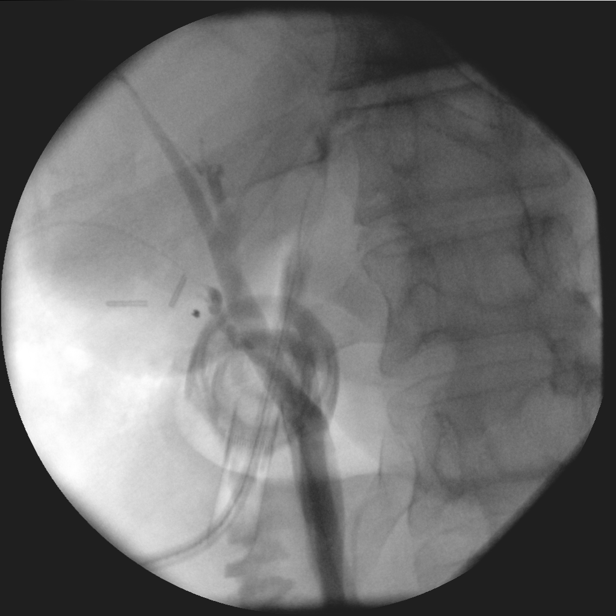

[Series 6: cont. · 6 of 22 frames shown (3 of 3)]
[frame 3/22]
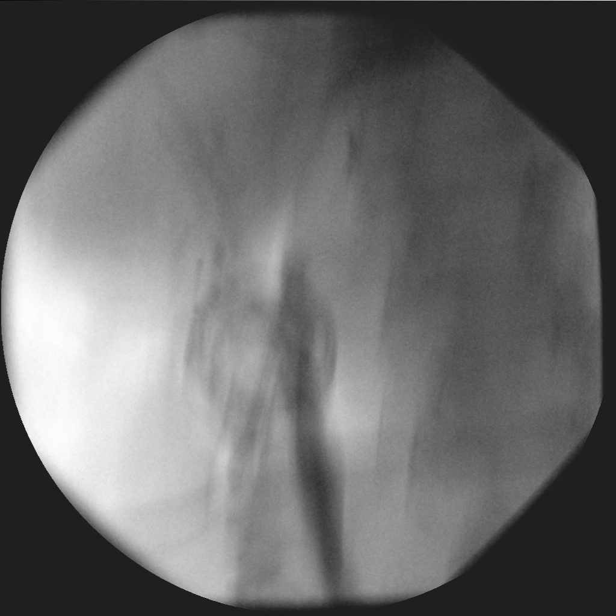
[frame 5/22]
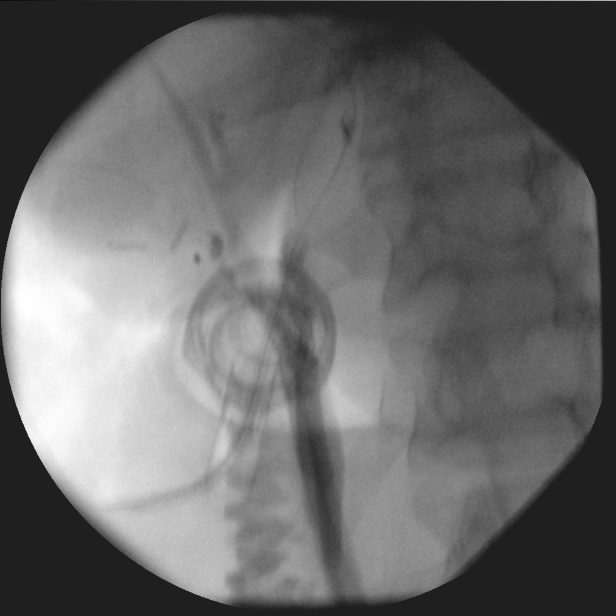
[frame 10/22]
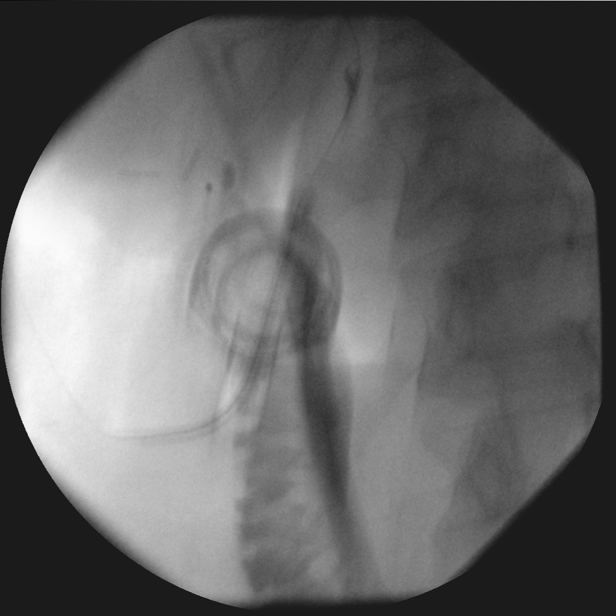
[frame 15/22]
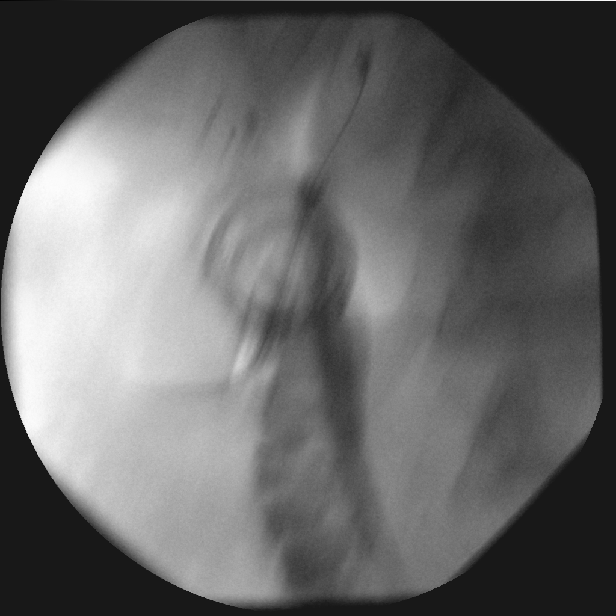
[frame 17/22]
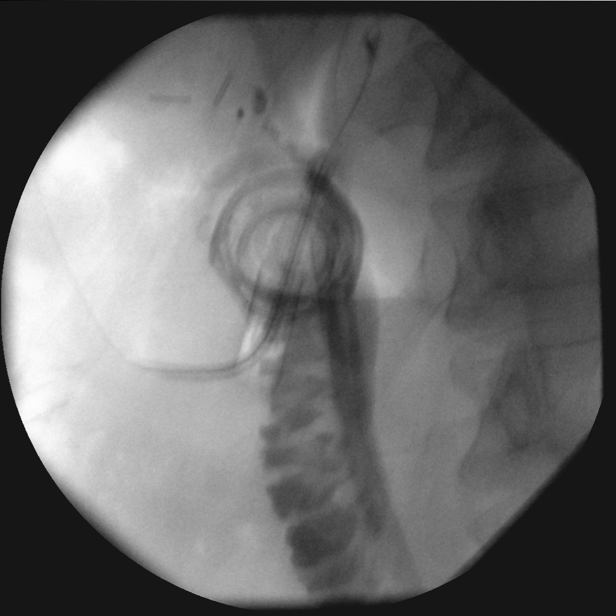
[frame 22/22]
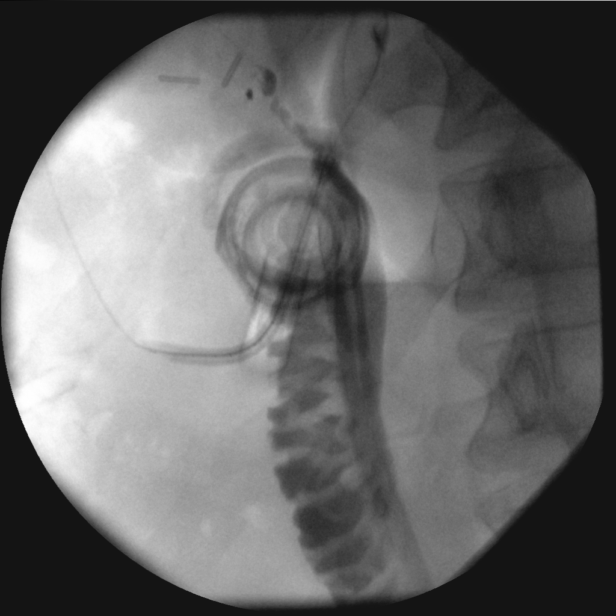

[14 of 22 positions shown; findings below may reference images not displayed]

FINDINGS: A single intraoperative image in 2 cine clips obtained during
intraoperative cholangiogram at the time of laparoscopic
cholecystectomy. The images demonstrate cannulation of the cystic
duct remnant and opacification of the biliary tree. There is overlap
with the right upper quadrant trocar port which does limit
evaluation somewhat. However, there is no evidence of biliary ductal
dilatation, stenosis, stricture or choledocholithiasis and contrast
material is noted passing through the ampulla and into the duodenum.
IMPRESSION: Negative intraoperative cholangiogram.

## 2017-11-15 NOTE — Progress Notes (Deleted)
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  INTERVAL HISTORY: Patient returns to clinic today for routine follow-up and discussion of his imaging results. He continues to feel well and remains asymptomatic. He has no neurologic complaints. He denies any recent fevers or illnesses. He denies any chest pain, cough, hemoptysis, or shortness of breath.  He has a good appetite and denies weight loss. He denies any pain. He has no nausea, vomiting, constipation, or diarrhea. He has no urinary complaints. Patient feels at his baseline and offers no specific complaints today.  REVIEW OF SYSTEMS:   Review of Systems  Constitutional: Negative.  Negative for fever, malaise/fatigue and weight loss.  Respiratory: Negative.  Negative for cough, hemoptysis and shortness of breath.   Cardiovascular: Negative.  Negative for chest pain and leg swelling.  Gastrointestinal: Negative.  Negative for abdominal pain.  Genitourinary: Negative.   Musculoskeletal: Negative.   Skin: Negative.  Negative for rash.  Neurological: Negative.  Negative for sensory change and weakness.  Psychiatric/Behavioral: Negative.  The patient is not nervous/anxious.     As per HPI. Otherwise, a complete review of systems is negative.  PAST MEDICAL HISTORY: Past Medical History:  Diagnosis Date  . Anemia   . Arthritis   . Deep vein thrombosis (DVT) of left lower extremity ([place])   . GERD (gastroesophageal reflux disease)   . History of blood transfusion   . Hypertension   . Kidney stones    mild renal insufficiency  . Left leg DVT ([facility])    S/P IVC filter placement 7/10, on Xarelto  . Lung cancer ([place])    T3,N0,M0, CT-guided needle BX's positive for poorly differentiated carcinoma    PAST SURGICAL HISTORY: Past Surgical History:  Procedure Laterality Date  . CHOLECYSTECTOMY N/A 01/26/2015   Procedure: LAPAROSCOPIC CHOLECYSTECTOMY;  Surgeon: [name], MD;  Location: [facility] ORS;  Service: General;  Laterality: N/A;  . cyst removed off of right side of neck    . INGUINAL HERNIA REPAIR    . port-a-cath placement    . VIDEO ASSISTED THORACOSCOPY (VATS)/WEDGE RESECTION Right 09/05/2013   Procedure: VIDEO ASSISTED THORACOSCOPY (VATS); Right Thoracotomy; Right Upper Lobectomy with Node Dissection and Enblock Resection of Mediastinal Invasion and Placement of OnQ Pain Pump;  Surgeon: Grace Isaac, MD;  Location: [facility];  Service: Thoracic;  Laterality: Right;  Marland Kitchen VIDEO BRONCHOSCOPY N/A 09/05/2013   Procedure: VIDEO BRONCHOSCOPY;  Surgeon: Grace Isaac, MD;  [facility];  Service: Thoracic;  Laterality: N/A;    FAMILY HISTORY: Family History  Problem Relation Age of Onset  . Diabetes type II Mother   . Hypertension Mother   . Diabetes type II Father   . Hypertension Father        ADVANCED DIRECTIVES (Y/N):  N   HEALTH MAINTENANCE: Social History   Tobacco Use  . Smoking status: Former Smoker    Packs/day: 1.00    Years: 20.00    Pack years: 20.00    Types: Cigarettes    Last attempt to quit: 03/02/2005    Years since quitting: 12.7  . Smokeless tobacco: Never Used  Substance Use Topics  . Alcohol use: Yes  Comment: ocassionally  . Drug use: No     Colonoscopy:  PAP:  Bone density:  Lipid panel:  Allergies  Allergen Reactions  . Emend [Aprepitant] Anaphylaxis  . Paclitaxel Anaphylaxis    Current Outpatient Medications  Medication Sig Dispense Refill  . Fexofenadine HCl (ALLEGRA PO) Take 1 tablet by mouth daily as needed (allergies).    . losartan (COZAAR) 100 MG tablet Take 100 mg  by mouth daily.     . nebivolol (BYSTOLIC) 5 MG tablet Take 1 tablet (5 mg total) by mouth daily. 90 tablet 3   No current facility-administered medications for this visit.     OBJECTIVE: There were no vitals filed for this visit.   There is no height or weight on file to calculate BMI.    ECOG FS:0 - Asymptomatic  General: Well-developed, well-nourished, no acute distress. Eyes: Pink conjunctiva, anicteric sclera. Lungs: Clear to auscultation bilaterally. Heart: Regular rate and rhythm. No rubs, murmurs, or gallops. Abdomen: Soft, nontender, nondistended. No organomegaly noted, normoactive bowel sounds. Musculoskeletal: No edema, cyanosis, or clubbing. Neuro: Alert, answering all questions appropriately. Cranial nerves grossly intact. Skin: No rashes or petechiae noted. Psych: Normal affect.   LAB RESULTS:  Lab Results  Component Value Date   NA 136 10/24/2015   K 3.7 10/24/2015   CL 103 10/24/2015   CO2 26 10/24/2015   GLUCOSE 119 (H) 10/24/2015   BUN 36 (H) 10/24/2015   CREATININE 2.50 (H) 11/13/2016   CALCIUM 9.4 10/24/2015   PROT 7.9 10/24/2015   ALBUMIN 4.6 10/24/2015   AST 25 10/24/2015   ALT 20 10/24/2015   ALKPHOS 63 10/24/2015   BILITOT 1.1 10/24/2015   GFRNONAA 33 (L) 11/01/2015   GFRAA 38 (L) 11/01/2015    Lab Results  Component Value Date   WBC 9.6 10/24/2015   NEUTROABS 7.6 (H) 10/24/2015   HGB 14.6 10/24/2015   HCT 41.2 10/24/2015   MCV 99.6 10/24/2015   PLT 189 10/24/2015     STUDIES: No results found.  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  PLAN:    1. 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 He subsequently underwent resection in August 2015. He did not complete adjuvant XRT secondary to side effects and discontinued treatment  in October 2015. His most recent CT scan on November 13, 2016 reviewed independently and reported as above with no evidence of recurrent or metastatic disease. No intervention is needed at this time. Return to clinic in 1 year with repeat imaging and further evaluation. 2. Renal insufficiency: Patient's creatinine appears to be at his baseline. Continue monitoring by nephrology. Patient will require noncontrast CTs.   Patient expressed understanding and was in agreement with this plan. He also understands that He can call clinic at any time with any questions, concerns, or complaints.   Cancer Staging Lung cancer, right upper lobe Staging form: Lung, AJCC 7th Edition - Clinical: No stage assigned - Unsigned - Pathologic: Stage IIIA (T4, N0, cM0) - Signed by Grace Isaac, MD on 09/29/2013   Lloyd Huger, MD   11/15/2017 11:09 PM

## 2017-11-16 ENCOUNTER — Telehealth: Payer: Self-pay | Admitting: Oncology

## 2017-11-16 ENCOUNTER — Ambulatory Visit: Payer: BLUE CROSS/BLUE SHIELD | Admitting: Oncology

## 2017-11-16 NOTE — Telephone Encounter (Signed)
Called pt to r\s appt for 9/26. Left pt VM to call me back to change appt.

## 2017-11-17 ENCOUNTER — Ambulatory Visit: Payer: BLUE CROSS/BLUE SHIELD | Attending: Oncology

## 2017-11-19 ENCOUNTER — Inpatient Hospital Stay: Payer: BLUE CROSS/BLUE SHIELD | Admitting: Oncology

## 2017-12-06 NOTE — Progress Notes (Signed)
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  INTERVAL HISTORY: Patient returns to clinic today for routine yearly follow-up.  He missed his scheduled CT scan in the past several weeks.  He continues to feel well and remains asymptomatic.  He has no neurologic complaints. He denies any recent fevers or illnesses. He denies any chest pain, cough, hemoptysis, or shortness of breath.  He has a good appetite and denies weight loss. He denies any pain. He has no nausea, vomiting, constipation, or diarrhea. He has no urinary complaints.  Patient feels at his baseline offers no specific complaints today.  REVIEW OF SYSTEMS:   Review of Systems  Constitutional: Negative.  Negative for fever, malaise/fatigue and weight loss.  Respiratory: Negative.  Negative for cough, hemoptysis and shortness of breath.   Cardiovascular: Negative.  Negative for chest pain and leg swelling.  Gastrointestinal: Negative.  Negative for abdominal pain.  Genitourinary: Negative.  Negative for dysuria.  Musculoskeletal: Negative.  Negative for back pain.  Skin: Negative.  Negative for rash.  Neurological: Negative.  Negative for dizziness, sensory change, weakness and headaches.  Psychiatric/Behavioral: Negative.  The patient is not nervous/anxious.     As per HPI. Otherwise, a complete review of systems is negative.  PAST MEDICAL HISTORY: Past Medical History:  Diagnosis Date  . Anemia   . Arthritis   . Deep vein thrombosis (DVT) of left lower extremity ([place])   . GERD (gastroesophageal reflux disease)   . History of blood  transfusion   . Hypertension   . Kidney stones    mild renal insufficiency  . Left leg DVT ([facility])    S/P IVC filter placement 7/10, on Xarelto  . Lung cancer ([place])    T3,N0,M0, CT-guided needle BX's positive for poorly differentiated carcinoma    PAST SURGICAL HISTORY: Past Surgical History:  Procedure Laterality Date  . CHOLECYSTECTOMY N/A 01/26/2015   Procedure: LAPAROSCOPIC CHOLECYSTECTOMY;  Surgeon: [name], MD;  Location: [facility] ORS;  Service: General;  Laterality: N/A;  . cyst removed off of right side of neck    . INGUINAL HERNIA REPAIR    . port-a-cath placement    . VIDEO ASSISTED THORACOSCOPY (VATS)/WEDGE RESECTION Right 09/05/2013   Procedure: VIDEO ASSISTED THORACOSCOPY (VATS); Right Thoracotomy; Right Upper Lobectomy with Node Dissection and Enblock Resection of Mediastinal Invasion and Placement of OnQ Pain Pump;  Surgeon: Grace Isaac, MD;  Location: [place];  Service: Thoracic;  Laterality: Right;  Marland Kitchen VIDEO BRONCHOSCOPY N/A 09/05/2013   Procedure: VIDEO BRONCHOSCOPY;  Surgeon: Grace Isaac, MD;  Location: [facility] OR;  Service: Thoracic;  Laterality: N/A;    FAMILY HISTORY: Family History  Problem Relation Age of Onset  . Diabetes type II Mother   . Hypertension Mother   . Diabetes type II Father   . Hypertension Father        ADVANCED DIRECTIVES (Y/N):  N   HEALTH MAINTENANCE: Social History   Tobacco Use  . Smoking status: Former Smoker    Packs/day: 1.00    Years: 20.00    Pack years: 20.00    Types: Cigarettes    Last attempt to quit: 03/02/2005    Years  since quitting: 12.7  . Smokeless tobacco: Never Used  Substance Use Topics  . Alcohol use: Yes    Comment: ocassionally  . Drug use: No     Colonoscopy:  PAP:  Bone density:  Lipid panel:  Allergies  Allergen Reactions  . Emend [Aprepitant] Anaphylaxis  . Paclitaxel Anaphylaxis    Current Outpatient Medications  Medication Sig Dispense Refill  . amLODipine (NORVASC) 2.5 MG  tablet Take by mouth.    . Fexofenadine HCl (ALLEGRA PO) Take 1 tablet by mouth daily as needed (allergies).    . losartan (COZAAR) 100 MG tablet Take 100 mg by mouth daily.     . nebivolol (BYSTOLIC) 5 MG tablet Take 1 tablet (5 mg total) by mouth daily. 90 tablet 3  . Omeprazole-Sodium Bicarbonate (ZEGERID) 20-1100 MG CAPS capsule Take by mouth.     No current facility-administered medications for this visit.     OBJECTIVE: Vitals:   12/07/17 1126  BP: (!) 144/95  Pulse: 70  Resp: 18  Temp: (!) 97.5 F (36.4 C)     Body mass index is 30.42 kg/m.    ECOG FS:0 - Asymptomatic  General: Well-developed, well-nourished, no acute distress. Eyes: Pink conjunctiva, anicteric sclera. HEENT: Normocephalic, moist mucous membranes. Lungs: Clear to auscultation bilaterally. Heart: Regular rate and rhythm. No rubs, murmurs, or gallops. Abdomen: Soft, nontender, nondistended. No organomegaly noted, normoactive bowel sounds. Musculoskeletal: No edema, cyanosis, or clubbing. Neuro: Alert, answering all questions appropriately. Cranial nerves grossly intact. Skin: No rashes or petechiae noted. Psych: Normal affect.  LAB RESULTS:  Lab Results  Component Value Date   NA 136 10/24/2015   K 3.7 10/24/2015   CL 103 10/24/2015   CO2 26 10/24/2015   GLUCOSE 119 (H) 10/24/2015   BUN 36 (H) 10/24/2015   CREATININE 2.50 (H) 11/13/2016   CALCIUM 9.4 10/24/2015   PROT 7.9 10/24/2015   ALBUMIN 4.6 10/24/2015   AST 25 10/24/2015   ALT 20 10/24/2015   ALKPHOS 63 10/24/2015   BILITOT 1.1 10/24/2015   GFRNONAA 33 (L) 11/01/2015   GFRAA 38 (L) 11/01/2015    Lab Results  Component Value Date   WBC 9.6 10/24/2015   NEUTROABS 7.6 (H) 10/24/2015   HGB 14.6 10/24/2015   HCT 41.2 10/24/2015   MCV 99.6 10/24/2015   PLT 189 10/24/2015     STUDIES: No results found.  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  PLAN:    1.  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  He underwent resection in August 2015.  This was followed by adjuvant XRT, but discontinued early in October 2015 secondary to side effects.  His most recent CT scan on November 13, 2016 reviewed independently with no evidence of recurrent or metastatic disease.  Patient will require repeat CT scan in the next 1 to 2 weeks.  He was then return to clinic in 1 year with CT scan 1 to 2 days prior to his appointment.  If CTs remain negative, this will be 5 years removed from completing his surgery and likely can be discharged from clinic.  Will recommend continued surveillance with primary care with CT screening program. 2. Renal insufficiency: Chronic and unchanged.  Continue follow-up with nephrology as scheduled.  Patient will require noncontrast CTs.   Patient expressed understanding and was in agreement with  this plan. He also understands that He can call clinic at any time with any questions, concerns, or complaints.   Cancer Staging Lung cancer, right upper lobe Staging form: Lung, AJCC 7th Edition - Clinical: No stage assigned - Unsigned - Pathologic: Stage IIIA (T4, N0, cM0) - Signed by Grace Isaac, MD on 09/29/2013   Lloyd Huger, MD   12/07/2017 11:51 AM

## 2017-12-07 ENCOUNTER — Encounter: Payer: Self-pay | Admitting: Oncology

## 2017-12-07 ENCOUNTER — Inpatient Hospital Stay: Payer: BLUE CROSS/BLUE SHIELD | Attending: Oncology | Admitting: Oncology

## 2017-12-07 VITALS — BP 144/95 | HR 70 | Temp 97.5°F | Resp 18 | Wt 194.2 lb

## 2017-12-07 DIAGNOSIS — M129 Arthropathy, unspecified: Secondary | ICD-10-CM

## 2017-12-07 DIAGNOSIS — Z9221 Personal history of antineoplastic chemotherapy: Secondary | ICD-10-CM | POA: Insufficient documentation

## 2017-12-07 DIAGNOSIS — K219 Gastro-esophageal reflux disease without esophagitis: Secondary | ICD-10-CM

## 2017-12-07 DIAGNOSIS — I10 Essential (primary) hypertension: Secondary | ICD-10-CM | POA: Diagnosis not present

## 2017-12-07 DIAGNOSIS — N289 Disorder of kidney and ureter, unspecified: Secondary | ICD-10-CM | POA: Insufficient documentation

## 2017-12-07 DIAGNOSIS — Z923 Personal history of irradiation: Secondary | ICD-10-CM | POA: Diagnosis not present

## 2017-12-07 DIAGNOSIS — Z87442 Personal history of urinary calculi: Secondary | ICD-10-CM | POA: Diagnosis not present

## 2017-12-07 DIAGNOSIS — Z87891 Personal history of nicotine dependence: Secondary | ICD-10-CM

## 2017-12-07 DIAGNOSIS — Z86718 Personal history of other venous thrombosis and embolism: Secondary | ICD-10-CM | POA: Diagnosis not present

## 2017-12-07 DIAGNOSIS — Z85118 Personal history of other malignant neoplasm of bronchus and lung: Secondary | ICD-10-CM | POA: Insufficient documentation

## 2017-12-07 DIAGNOSIS — D649 Anemia, unspecified: Secondary | ICD-10-CM | POA: Diagnosis not present

## 2017-12-07 DIAGNOSIS — C3411 Malignant neoplasm of upper lobe, right bronchus or lung: Secondary | ICD-10-CM

## 2017-12-07 DIAGNOSIS — Z79899 Other long term (current) drug therapy: Secondary | ICD-10-CM | POA: Diagnosis not present

## 2017-12-07 NOTE — Progress Notes (Signed)
Pt in for yearly follow up, denies any concerns.

## 2017-12-14 ENCOUNTER — Ambulatory Visit
Admission: RE | Admit: 2017-12-14 | Discharge: 2017-12-14 | Disposition: A | Discharge status: Home / Self Care | Payer: BLUE CROSS/BLUE SHIELD | Source: Ambulatory Visit | Attending: Oncology | Admitting: Oncology

## 2017-12-14 DIAGNOSIS — I700 Atherosclerosis of aorta: Secondary | ICD-10-CM | POA: Insufficient documentation

## 2017-12-14 DIAGNOSIS — C3411 Malignant neoplasm of upper lobe, right bronchus or lung: Secondary | ICD-10-CM | POA: Insufficient documentation

## 2017-12-14 DIAGNOSIS — Z902 Acquired absence of lung [part of]: Secondary | ICD-10-CM | POA: Diagnosis not present

## 2018-06-15 ENCOUNTER — Other Ambulatory Visit (HOSPITAL_COMMUNITY): Payer: Self-pay | Admitting: Family Medicine

## 2018-06-15 ENCOUNTER — Other Ambulatory Visit: Payer: Self-pay | Admitting: Family Medicine

## 2018-06-15 ENCOUNTER — Other Ambulatory Visit: Payer: Self-pay

## 2018-06-15 ENCOUNTER — Ambulatory Visit
Admission: RE | Admit: 2018-06-15 | Discharge: 2018-06-15 | Disposition: A | Discharge status: Home / Self Care | Payer: BLUE CROSS/BLUE SHIELD | Source: Ambulatory Visit | Attending: Family Medicine | Admitting: Family Medicine

## 2018-06-15 DIAGNOSIS — R600 Localized edema: Secondary | ICD-10-CM

## 2018-12-06 NOTE — Progress Notes (Deleted)
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  INTERVAL HISTORY: Patient returns to clinic today for routine yearly follow-up.  He missed his scheduled CT scan in the past several weeks.  He continues to feel well and remains asymptomatic.  He has no neurologic complaints. He denies any recent fevers or illnesses. He denies any chest pain, cough, hemoptysis, or shortness of breath.  He has a good appetite and denies weight loss. He denies any pain. He has no nausea, vomiting, constipation, or diarrhea. He has no urinary complaints.  Patient feels at his baseline offers no specific complaints today.  REVIEW OF SYSTEMS:   Review of Systems  Constitutional: Negative.  Negative for fever, malaise/fatigue and weight loss.  Respiratory: Negative.  Negative for cough, hemoptysis and shortness of breath.   Cardiovascular: Negative.  Negative for chest pain and leg swelling.  Gastrointestinal: Negative.  Negative for abdominal pain.  Genitourinary: Negative.  Negative for dysuria.  Musculoskeletal: Negative.  Negative for back pain.  Skin: Negative.  Negative for rash.  Neurological: Negative.  Negative for dizziness, sensory change, weakness and headaches.  Psychiatric/Behavioral: Negative.  The patient is not nervous/anxious.     As per HPI. Otherwise, a complete review of systems is negative.  PAST MEDICAL HISTORY: Past Medical History:  Diagnosis Date  . Anemia   . Arthritis   . Deep vein thrombosis (DVT) of left lower extremity ([place])   . GERD (gastroesophageal reflux disease)   . History of blood  transfusion   . Hypertension   . Kidney stones    mild renal insufficiency  . Left leg DVT ([facility])    S/P IVC filter placement 7/10, on Xarelto  . Lung cancer ([place])    T3,N0,M0, CT-guided needle BX's positive for poorly differentiated carcinoma    PAST SURGICAL HISTORY: Past Surgical History:  Procedure Laterality Date  . CHOLECYSTECTOMY N/A 01/26/2015   Procedure: LAPAROSCOPIC CHOLECYSTECTOMY;  Surgeon: [name], MD;  Location: [facility] ORS;  Service: General;  Laterality: N/A;  . cyst removed off of right side of neck    . INGUINAL HERNIA REPAIR    . port-a-cath placement    . VIDEO ASSISTED THORACOSCOPY (VATS)/WEDGE RESECTION Right 09/05/2013   Procedure: VIDEO ASSISTED THORACOSCOPY (VATS); Right Thoracotomy; Right Upper Lobectomy with Node Dissection and Enblock Resection of Mediastinal Invasion and Placement of OnQ Pain Pump;  Surgeon: Grace Isaac, MD;  Location: [facility] [place];  Service: Thoracic;  Laterality: Right;  Marland Kitchen VIDEO BRONCHOSCOPY N/A 09/05/2013   Procedure: VIDEO BRONCHOSCOPY;  Surgeon: Grace Isaac, MD;  Location: [facility] OR;  Service: Thoracic;  Laterality: N/A;    FAMILY HISTORY: Family History  Problem Relation Age of Onset  . Diabetes type II Mother   . Hypertension Mother   . Diabetes type II Father   . Hypertension Father        ADVANCED DIRECTIVES (Y/N):  N   HEALTH MAINTENANCE: Social History   Tobacco Use  . Smoking status: Former Smoker    Packs/day: 1.00    Years: 20.00    Pack years: 20.00    Types: Cigarettes    Quit date: 03/02/2005    Years since quitting:  13.7  . Smokeless tobacco: Never Used  Substance Use Topics  . Alcohol use: Yes    Comment: ocassionally  . Drug use: No     Colonoscopy:  PAP:  Bone density:  Lipid panel:  Allergies  Allergen Reactions  . Emend [Aprepitant] Anaphylaxis  . Paclitaxel Anaphylaxis    Current Outpatient Medications  Medication Sig Dispense Refill  . amLODipine (NORVASC) 2.5 MG tablet Take  by mouth.    . Fexofenadine HCl (ALLEGRA PO) Take 1 tablet by mouth daily as needed (allergies).    . losartan (COZAAR) 100 MG tablet Take 100 mg by mouth daily.     . nebivolol (BYSTOLIC) 5 MG tablet Take 1 tablet (5 mg total) by mouth daily. 90 tablet 3  . Omeprazole-Sodium Bicarbonate (ZEGERID) 20-1100 MG CAPS capsule Take by mouth.     No current facility-administered medications for this visit.     OBJECTIVE: There were no vitals filed for this visit.   There is no height or weight on file to calculate BMI.    ECOG FS:0 - Asymptomatic  General: Well-developed, well-nourished, no acute distress. Eyes: Pink conjunctiva, anicteric sclera. HEENT: Normocephalic, moist mucous membranes. Lungs: Clear to auscultation bilaterally. Heart: Regular rate and rhythm. No rubs, murmurs, or gallops. Abdomen: Soft, nontender, nondistended. No organomegaly noted, normoactive bowel sounds. Musculoskeletal: No edema, cyanosis, or clubbing. Neuro: Alert, answering all questions appropriately. Cranial nerves grossly intact. Skin: No rashes or petechiae noted. Psych: Normal affect.  LAB RESULTS:  Lab Results  Component Value Date   NA 136 10/24/2015   K 3.7 10/24/2015   CL 103 10/24/2015   CO2 26 10/24/2015   GLUCOSE 119 (H) 10/24/2015   BUN 36 (H) 10/24/2015   CREATININE 2.50 (H) 11/13/2016   CALCIUM 9.4 10/24/2015   PROT 7.9 10/24/2015   ALBUMIN 4.6 10/24/2015   AST 25 10/24/2015   ALT 20 10/24/2015   ALKPHOS 63 10/24/2015   BILITOT 1.1 10/24/2015   GFRNONAA 33 (L) 11/01/2015   GFRAA 38 (L) 11/01/2015    Lab Results  Component Value Date   WBC 9.6 10/24/2015   NEUTROABS 7.6 (H) 10/24/2015   HGB 14.6 10/24/2015   HCT 41.2 10/24/2015   MCV 99.6 10/24/2015   PLT 189 10/24/2015     STUDIES: No results found.  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  PLAN:    1. Stage IIIa poorly differentiated carcinoma of  the right upper lobe lung: Patient completed 4 cycles of neoadjuvant chemotherapy with cisplatin and gemcitabine in approximately May 2015.  He underwent resection in August 2015.  This was followed by adjuvant XRT, but discontinued early in October 2015 secondary to side effects.  His most recent CT scan on November 13, 2016 reviewed independently with no evidence of recurrent or metastatic disease.  Patient will require repeat CT scan in the next 1 to 2 weeks.  He was then return to clinic in 1 year with CT scan 1 to 2 days prior to his appointment.  If CTs remain negative, this will be 5 years removed from completing his surgery and likely can be discharged from clinic.  Will recommend continued surveillance with primary care with CT screening program. 2. Renal insufficiency: Chronic and unchanged.  Continue follow-up with nephrology as scheduled.  Patient will require noncontrast CTs.   Patient expressed understanding and was in agreement with this plan. He also understands that He can call clinic at any time  with any questions, concerns, or complaints.   Cancer Staging Lung cancer, right upper lobe Staging form: Lung, AJCC 7th Edition - Clinical: No stage assigned - Unsigned - Pathologic: Stage IIIA (T4, N0, cM0) - Signed by Grace Isaac, MD on 09/29/2013   Lloyd Huger, MD   12/06/2018 7:44 PM

## 2018-12-07 ENCOUNTER — Ambulatory Visit: Payer: Self-pay | Attending: Oncology

## 2018-12-09 ENCOUNTER — Inpatient Hospital Stay: Payer: Self-pay | Admitting: Oncology

## 2018-12-09 ENCOUNTER — Telehealth: Payer: Self-pay | Admitting: Oncology

## 2018-12-09 NOTE — Telephone Encounter (Signed)
Called pt to r\s appt for 10/15 per his request. Message received from [facility] to cancel as pt couldn't come today. Left detailed message to contact office to r\s.

## 2022-01-14 ENCOUNTER — Encounter: Payer: Self-pay | Admitting: Ophthalmology

## 2022-01-14 NOTE — Discharge Instructions (Signed)

## 2022-01-20 ENCOUNTER — Ambulatory Visit: Payer: No Typology Code available for payment source | Admitting: General Practice

## 2022-01-20 ENCOUNTER — Encounter: Payer: Self-pay | Admitting: Ophthalmology

## 2022-01-20 ENCOUNTER — Encounter
Admission: RE | Disposition: A | Discharge status: Home / Self Care | Payer: Self-pay | Source: Home / Self Care | Attending: Ophthalmology

## 2022-01-20 ENCOUNTER — Other Ambulatory Visit: Payer: Self-pay

## 2022-01-20 ENCOUNTER — Ambulatory Visit
Admission: RE | Admit: 2022-01-20 | Discharge: 2022-01-20 | Disposition: A | Discharge status: Home / Self Care | Payer: No Typology Code available for payment source | Attending: Ophthalmology | Admitting: Ophthalmology

## 2022-01-20 DIAGNOSIS — E1122 Type 2 diabetes mellitus with diabetic chronic kidney disease: Secondary | ICD-10-CM | POA: Insufficient documentation

## 2022-01-20 DIAGNOSIS — I129 Hypertensive chronic kidney disease with stage 1 through stage 4 chronic kidney disease, or unspecified chronic kidney disease: Secondary | ICD-10-CM | POA: Insufficient documentation

## 2022-01-20 DIAGNOSIS — R001 Bradycardia, unspecified: Secondary | ICD-10-CM | POA: Insufficient documentation

## 2022-01-20 DIAGNOSIS — K219 Gastro-esophageal reflux disease without esophagitis: Secondary | ICD-10-CM | POA: Insufficient documentation

## 2022-01-20 DIAGNOSIS — Z87891 Personal history of nicotine dependence: Secondary | ICD-10-CM | POA: Insufficient documentation

## 2022-01-20 DIAGNOSIS — Z539 Procedure and treatment not carried out, unspecified reason: Secondary | ICD-10-CM | POA: Insufficient documentation

## 2022-01-20 HISTORY — PX: CATARACT EXTRACTION W/PHACO: SHX586

## 2022-01-20 HISTORY — DX: Presence of external hearing-aid: Z97.4

## 2022-01-20 HISTORY — DX: Chronic kidney disease, stage 3 unspecified: N18.30

## 2022-01-20 SURGERY — PHACOEMULSIFICATION, CATARACT, WITH IOL INSERTION
Anesthesia: Topical | Site: Eye | Laterality: Right

## 2022-01-20 MED ORDER — ARMC OPHTHALMIC DILATING DROPS
1.0000 | OPHTHALMIC | Status: DC | PRN
  Administered 2022-01-20 (×3): 1 via OPHTHALMIC

## 2022-01-20 MED ORDER — FENTANYL CITRATE PF 50 MCG/ML IJ SOSY: 25.0000 ug | PREFILLED_SYRINGE | INTRAMUSCULAR | Status: DC | PRN

## 2022-01-20 MED ORDER — TETRACAINE HCL 0.5 % OP SOLN
1.0000 [drp] | OPHTHALMIC | Status: DC | PRN
  Administered 2022-01-20 (×2): 1 [drp] via OPHTHALMIC

## 2022-01-20 MED ORDER — ONDANSETRON HCL 4 MG/2ML IJ SOLN: 4.0000 mg | Freq: Once | INTRAMUSCULAR | Status: DC | PRN

## 2022-01-20 MED ORDER — LACTATED RINGERS IV SOLN: INTRAVENOUS | Status: DC

## 2022-01-20 SURGICAL SUPPLY — 14 items
CANNULA ANT/CHMB 27GA (MISCELLANEOUS) IMPLANT
CATARACT SUITE SIGHTPATH (MISCELLANEOUS) ×1 IMPLANT
DISSECTOR HYDRO NUCLEUS 50X22 (MISCELLANEOUS) ×1 IMPLANT
GLOVE SURG GAMMEX PI TX LF 7.5 (GLOVE) ×1 IMPLANT
GLOVE SURG SYN 8.5  E (GLOVE) ×1
GLOVE SURG SYN 8.5 E (GLOVE) ×1 IMPLANT
NEEDLE FILTER BLUNT 18X1 1/2 (NEEDLE) ×1 IMPLANT
PACK VIT ANT 23G (MISCELLANEOUS) IMPLANT
RING MALYGIN (MISCELLANEOUS) IMPLANT
SUT ETHILON 10-0 CS-B-6CS-B-6 (SUTURE)
SUTURE EHLN 10-0 CS-B-6CS-B-6 (SUTURE) IMPLANT
SYR 3ML LL SCALE MARK (SYRINGE) ×1 IMPLANT
SYR 5ML LL (SYRINGE) ×1 IMPLANT
WATER STERILE IRR 250ML POUR (IV SOLUTION) ×1 IMPLANT

## 2022-01-20 NOTE — H&P (Addendum)
[facility]   Primary Care Physician:  Dion Body, MD Ophthalmologist: Dr. Benay Pillow  Pre-Procedure History & Physical: HPI:  Karl Wise is a 56 y.o. male here for cataract surgery.   Past Medical History:  Diagnosis Date   Anemia    Arthritis    CKD (chronic kidney disease), stage III (HCC)    Deep vein thrombosis (DVT) of left lower extremity (HCC)    GERD (gastroesophageal reflux disease)    History of blood transfusion    Hypertension    Kidney stones    mild renal insufficiency   Left leg DVT (HCC)    S/P IVC filter placement 7/10, on Xarelto   Lung cancer (HCC)    T3,N0,M0, CT-guided needle BX's positive for poorly differentiated carcinoma   Wears hearing aid in both ears     Past Surgical History:  Procedure Laterality Date   CHOLECYSTECTOMY N/A 01/26/2015   Procedure: LAPAROSCOPIC CHOLECYSTECTOMY;  Surgeon: [name], MD;  Location: [facility];  Service: General;  Laterality: N/A;   cyst removed off of right side of neck     INGUINAL HERNIA REPAIR     port-a-cath placement     VIDEO ASSISTED THORACOSCOPY (VATS)/WEDGE RESECTION Right 09/05/2013   Procedure: VIDEO ASSISTED THORACOSCOPY (VATS); Right Thoracotomy; Right Upper Lobectomy with Node Dissection and Enblock Resection of Mediastinal Invasion and Placement of OnQ Pain Pump;  Surgeon: Grace Isaac, MD;  Location: [facility];  Service: Thoracic;  Laterality: Right;   VIDEO BRONCHOSCOPY N/A 09/05/2013   Procedure: VIDEO BRONCHOSCOPY;  Surgeon: Grace Isaac, MD;  Location: [facility];  Service: Thoracic;  Laterality: N/A;    Prior to Admission medications   Medication Sig Start Date End Date Taking? Authorizing Provider  allopurinol (ZYLOPRIM) 100 MG tablet Take 100 mg by mouth daily.   Yes [provider]  amLODipine (NORVASC) 2.5 MG tablet Take 5 mg by mouth daily. 06/03/17  Yes [provider]  Ascorbic Acid (VITAMIN C PO) Take by mouth daily.   Yes [provider]  carvedilol (COREG) 6.25 MG tablet Take 6.25 mg by mouth.   Yes [provider]  Ergocalciferol (VITAMIN D2 PO) Take by mouth daily.   Yes [provider]  Fexofenadine HCl (ALLEGRA PO) Take 1 tablet by mouth daily as needed (allergies).   Yes [provider]  hydrALAZINE (APRESOLINE) 50 MG tablet Take 50 mg by mouth 3 (three) times daily.   Yes [provider]  losartan (COZAAR) 100 MG tablet Take 100 mg by mouth daily.  10/29/16  Yes [provider]    Allergies as of 01/09/2022 - Review Complete 12/07/2017  Allergen Reaction Noted   Emend [aprepitant] Anaphylaxis 06/30/2013   Paclitaxel Anaphylaxis 06/30/2013    Family History  Problem Relation Age of Onset   Diabetes type II Mother    Hypertension Mother    Diabetes type II Father    Hypertension Father     Social History   Socioeconomic History   Marital status: Divorced    Spouse name: Not on file   Number of children: Not on file   Years of education: Not on file   Highest education level: Not on file  Occupational History   Occupation: [employer]    Comment: salesman  Tobacco Use   Smoking status: Former    Packs/day: 1.00    Years: 20.00    Total pack years: 20.00    Types: Cigarettes    Quit  date: 03/02/2005    Years since quitting: 16.8   Smokeless tobacco: Never  Vaping Use   Vaping Use: Never used  Substance and Sexual Activity   Alcohol use: Yes    Comment: ocassionally   Drug use: No   Sexual activity: Not on file  Other Topics Concern   Not on file  Social History Narrative   Not on file   Social Determinants of Health   Financial Resource Strain: Not on file  Food Insecurity: Not on file  Transportation Needs: Not on file  Physical Activity: Not on file  Stress: Not on file  Social Connections: Not on file  Intimate Partner Violence: Not on file    Review of Systems: See HPI, otherwise negative ROS  Physical Exam: BP  104/75   Temp 98.9 F (37.2 C) (Tympanic)   Resp 15   Ht 5' 5.98" (1.676 m)   Wt 93 kg   SpO2 94%   BMI 33.10 kg/m  General:   Alert, cooperative in NAD Head:  Normocephalic and atraumatic. Respiratory:  Normal work of breathing. Cardiovascular:  on Karl monitor, there is an irregular heart rhythm.  Impression/Plan: Karl Wise is here for cataract surgery.  During placement of Karl IV, there was bradycardia.  And Karl patient was noted to have an irregular heart rhythm.   Karl surgery was canceled for today, to be rescheduled.     Benay Pillow, MD  01/20/2022, 9:40 AM

## 2022-01-20 NOTE — Anesthesia Preprocedure Evaluation (Signed)
Anesthesia Evaluation  Patient identified by MRN, date of birth, ID band Patient awake    Reviewed: Allergy & Precautions, H&P , NPO status , Patient's Chart, lab work & pertinent test results, reviewed documented beta blocker date and time   Airway Mallampati: II  TM Distance: >3 FB Neck ROM: full    Dental no notable dental hx. (+) Teeth Intact   Pulmonary neg pulmonary ROS, former smoker   Pulmonary exam normal breath sounds clear to auscultation       Cardiovascular Exercise Tolerance: Good hypertension, On Medications negative cardio ROS  Rhythm:regular Rate:Normal     Neuro/Psych negative neurological ROS  negative psych ROS   GI/Hepatic Neg liver ROS,GERD  Medicated,,  Endo/Other  negative endocrine ROSdiabetes    Renal/GU Renal disease     Musculoskeletal   Abdominal   Peds  Hematology  (+) Blood dyscrasia, anemia   Anesthesia Other Findings   Reproductive/Obstetrics negative OB ROS                             Anesthesia Physical Anesthesia Plan  ASA: 3  Anesthesia Plan: MAC   Post-op Pain Management:    Induction:   PONV Risk Score and Plan: 2  Airway Management Planned:   Additional Equipment:   Intra-op Plan:   Post-operative Plan:   Informed Consent: I have reviewed the patients History and Physical, chart, labs and discussed the procedure including the risks, benefits and alternatives for the proposed anesthesia with the patient or authorized representative who has indicated his/her understanding and acceptance.       Plan Discussed with: CRNA  Anesthesia Plan Comments:        Anesthesia Quick Evaluation

## 2022-01-20 NOTE — Progress Notes (Signed)
Canceled per anesthesia due to heart issues.

## 2022-01-21 ENCOUNTER — Encounter: Payer: Self-pay | Admitting: Ophthalmology

## 2022-01-29 ENCOUNTER — Encounter: Payer: Self-pay | Admitting: Ophthalmology

## 2022-01-30 NOTE — Discharge Instructions (Signed)

## 2022-02-03 ENCOUNTER — Encounter: Payer: Self-pay | Admitting: Ophthalmology

## 2022-02-03 ENCOUNTER — Ambulatory Visit
Admission: RE | Admit: 2022-02-03 | Discharge: 2022-02-03 | Disposition: A | Discharge status: Home / Self Care | Payer: No Typology Code available for payment source | Attending: Ophthalmology | Admitting: Ophthalmology

## 2022-02-03 ENCOUNTER — Ambulatory Visit: Payer: No Typology Code available for payment source | Admitting: Anesthesiology

## 2022-02-03 ENCOUNTER — Encounter
Admission: RE | Disposition: A | Discharge status: Home / Self Care | Payer: Self-pay | Source: Home / Self Care | Attending: Ophthalmology

## 2022-02-03 ENCOUNTER — Other Ambulatory Visit: Payer: Self-pay

## 2022-02-03 DIAGNOSIS — N183 Chronic kidney disease, stage 3 unspecified: Secondary | ICD-10-CM | POA: Diagnosis not present

## 2022-02-03 DIAGNOSIS — Z87891 Personal history of nicotine dependence: Secondary | ICD-10-CM | POA: Insufficient documentation

## 2022-02-03 DIAGNOSIS — K219 Gastro-esophageal reflux disease without esophagitis: Secondary | ICD-10-CM | POA: Insufficient documentation

## 2022-02-03 DIAGNOSIS — H2511 Age-related nuclear cataract, right eye: Secondary | ICD-10-CM | POA: Diagnosis not present

## 2022-02-03 DIAGNOSIS — E1122 Type 2 diabetes mellitus with diabetic chronic kidney disease: Secondary | ICD-10-CM | POA: Diagnosis not present

## 2022-02-03 DIAGNOSIS — Z79899 Other long term (current) drug therapy: Secondary | ICD-10-CM | POA: Diagnosis not present

## 2022-02-03 DIAGNOSIS — I129 Hypertensive chronic kidney disease with stage 1 through stage 4 chronic kidney disease, or unspecified chronic kidney disease: Secondary | ICD-10-CM | POA: Insufficient documentation

## 2022-02-03 DIAGNOSIS — E1136 Type 2 diabetes mellitus with diabetic cataract: Secondary | ICD-10-CM | POA: Insufficient documentation

## 2022-02-03 HISTORY — PX: CATARACT EXTRACTION W/PHACO: SHX586

## 2022-02-03 SURGERY — PHACOEMULSIFICATION, CATARACT, WITH IOL INSERTION
Anesthesia: Monitor Anesthesia Care | Site: Eye | Laterality: Right

## 2022-02-03 MED ORDER — MIDAZOLAM HCL 2 MG/2ML IJ SOLN
INTRAMUSCULAR | Status: DC | PRN
  Administered 2022-02-03: 2 mg via INTRAVENOUS

## 2022-02-03 MED ORDER — ARMC OPHTHALMIC DILATING DROPS
1.0000 | OPHTHALMIC | Status: DC | PRN
  Administered 2022-02-03 (×3): 1 via OPHTHALMIC

## 2022-02-03 MED ORDER — LIDOCAINE HCL (PF) 2 % IJ SOLN
INTRAOCULAR | Status: DC | PRN
  Administered 2022-02-03: 1 mL via INTRAOCULAR

## 2022-02-03 MED ORDER — SIGHTPATH DOSE#1 BSS IO SOLN
INTRAOCULAR | Status: DC | PRN
  Administered 2022-02-03: 87 mL via OPHTHALMIC

## 2022-02-03 MED ORDER — SODIUM CHLORIDE 0.9% FLUSH
INTRAVENOUS | Status: DC | PRN
  Administered 2022-02-03: 10 mL via INTRAVENOUS

## 2022-02-03 MED ORDER — SIGHTPATH DOSE#1 NA HYALUR & NA CHOND-NA HYALUR IO KIT
PACK | INTRAOCULAR | Status: DC | PRN
  Administered 2022-02-03: 1 via OPHTHALMIC

## 2022-02-03 MED ORDER — SIGHTPATH DOSE#1 BSS IO SOLN
INTRAOCULAR | Status: DC | PRN
  Administered 2022-02-03: 15 mL

## 2022-02-03 MED ORDER — MOXIFLOXACIN HCL 0.5 % OP SOLN
OPHTHALMIC | Status: DC | PRN
  Administered 2022-02-03: .2 mL via OPHTHALMIC

## 2022-02-03 MED ORDER — TETRACAINE HCL 0.5 % OP SOLN
1.0000 [drp] | OPHTHALMIC | Status: DC | PRN
  Administered 2022-02-03 (×3): 1 [drp] via OPHTHALMIC

## 2022-02-03 SURGICAL SUPPLY — 14 items
CARTRIDGE ABBOTT (MISCELLANEOUS) IMPLANT
CATARACT SUITE SIGHTPATH (MISCELLANEOUS) ×1 IMPLANT
DISSECTOR HYDRO NUCLEUS 50X22 (MISCELLANEOUS) ×1 IMPLANT
FEE CATARACT SUITE SIGHTPATH (MISCELLANEOUS) ×1 IMPLANT
GLOVE SURG GAMMEX PI TX LF 7.5 (GLOVE) ×1 IMPLANT
GLOVE SURG SYN 8.5  E (GLOVE) ×1
GLOVE SURG SYN 8.5 E (GLOVE) ×1 IMPLANT
GLOVE SURG SYN 8.5 PF PI (GLOVE) ×1 IMPLANT
LENS IOL TECNIS EYHANCE 18.0 (Intraocular Lens) IMPLANT
NDL FILTER BLUNT 18X1 1/2 (NEEDLE) ×1 IMPLANT
NEEDLE FILTER BLUNT 18X1 1/2 (NEEDLE) ×1 IMPLANT
SYR 3ML LL SCALE MARK (SYRINGE) ×1 IMPLANT
SYR 5ML LL (SYRINGE) ×1 IMPLANT
WATER STERILE IRR 250ML POUR (IV SOLUTION) ×1 IMPLANT

## 2022-02-03 NOTE — Op Note (Signed)
OPERATIVE NOTE  Karl Wise [id number] 02/03/2022   PREOPERATIVE DIAGNOSIS:  Nuclear sclerotic cataract right eye.  H25.11   POSTOPERATIVE DIAGNOSIS:    Nuclear sclerotic cataract right eye.     PROCEDURE:  Phacoemusification with posterior chamber intraocular lens placement of the right eye   LENS:   Implant Name Type Inv. Item Serial No. Manufacturer Lot No. LRB No. Used Action  LENS IOL TECNIS EYHANCE 18.0 - [id number] Intraocular Lens LENS IOL TECNIS EYHANCE 18.0 [id number] [employer]  Right 1 Implanted       Procedure(s): CATARACT EXTRACTION PHACO AND INTRAOCULAR LENS PLACEMENT (IOC) RIGHT  2.03  00:20.0 (Right)  DIB00 +18.0   ULTRASOUND TIME: 0 minutes 20 seconds.  CDE 2.03   SURGEON:  Benay Pillow, MD, MPH  ANESTHESIOLOGIST: Anesthesiologist: Ilene Qua, MD CRNA: Candice Camp, CRNA   ANESTHESIA:  Topical with tetracaine drops augmented with 1% preservative-free intracameral lidocaine.  ESTIMATED BLOOD LOSS: less than 1 mL.   COMPLICATIONS:  None.   DESCRIPTION OF PROCEDURE:  The patient was identified in the holding room and transported to the operating room and placed in the supine position under the operating microscope.  The right eye was identified as the operative eye and it was prepped and draped in the usual sterile ophthalmic fashion.   A 1.0 millimeter clear-corneal paracentesis was made at the 10:30 position. 0.5 ml of preservative-free 1% lidocaine with epinephrine was injected into the anterior chamber.  The anterior chamber was filled with viscoelastic.  A 2.4 millimeter keratome was used to make a near-clear corneal incision at the 8:00 position.  A curvilinear capsulorrhexis was made with a cystotome and capsulorrhexis forceps.  Balanced salt solution was used to hydrodissect and hydrodelineate the nucleus.   Phacoemulsification was then used in stop and chop fashion to remove the lens nucleus and epinucleus.  The remaining cortex was then  removed using the irrigation and aspiration handpiece. Viscoelastic was then placed into the capsular bag to distend it for lens placement.  A lens was then injected into the capsular bag.  The remaining viscoelastic was aspirated.   Wounds were hydrated with balanced salt solution.  The anterior chamber was inflated to a physiologic pressure with balanced salt solution.   Intracameral vigamox 0.1 mL undiluted was injected into the eye and a drop placed onto the ocular surface.  No wound leaks were noted.  The patient was taken to the recovery room in stable condition without complications of anesthesia or surgery  Benay Pillow 02/03/2022, 11:23 AM

## 2022-02-03 NOTE — Transfer of Care (Signed)
Immediate Anesthesia Transfer of Care Note  Patient: Karl Wise  Procedure(s) Performed: CATARACT EXTRACTION PHACO AND INTRAOCULAR LENS PLACEMENT (IOC) RIGHT  2.03  00:20.0 (Right: Eye)  Patient Location: PACU  Anesthesia Type: MAC  Level of Consciousness: awake, alert  and patient cooperative  Airway and Oxygen Therapy: Patient Spontanous Breathing and Patient connected to supplemental oxygen  Post-op Assessment: Post-op Vital signs reviewed, Patient's Cardiovascular Status Stable, Respiratory Function Stable, Patent Airway and No signs of Nausea or vomiting  Post-op Vital Signs: Reviewed and stable  Complications: No notable events documented.

## 2022-02-03 NOTE — Anesthesia Postprocedure Evaluation (Signed)
Anesthesia Post Note  Patient: Karl Wise  Procedure(s) Performed: CATARACT EXTRACTION PHACO AND INTRAOCULAR LENS PLACEMENT (IOC) RIGHT  2.03  00:20.0 (Right: Eye)  Patient location during evaluation: PACU Anesthesia Type: MAC Level of consciousness: awake and alert Pain management: pain level controlled Vital Signs Assessment: post-procedure vital signs reviewed and stable Respiratory status: spontaneous breathing, nonlabored ventilation, respiratory function stable and patient connected to nasal cannula oxygen Cardiovascular status: stable and blood pressure returned to baseline Postop Assessment: no apparent nausea or vomiting Anesthetic complications: no   No notable events documented.   Last Vitals:  Vitals:   02/03/22 1124 02/03/22 1128  BP: 133/81 131/82  Pulse: 70 70  Resp: 12 18  Temp: 36.4 C 36.4 C  SpO2: 95% 96%    Last Pain:  Vitals:   02/03/22 1128  TempSrc:   PainSc: 0-No pain                 Ilene Qua

## 2022-02-03 NOTE — Anesthesia Preprocedure Evaluation (Signed)
Anesthesia Evaluation  Patient identified by MRN, date of birth, ID band Patient awake    Reviewed: Allergy & Precautions, H&P , NPO status , Patient's Chart, lab work & pertinent test results, reviewed documented beta blocker date and time   Airway Mallampati: II  TM Distance: >3 FB Neck ROM: full    Dental no notable dental hx. (+) Teeth Intact   Pulmonary neg pulmonary ROS, former smoker   Pulmonary exam normal breath sounds clear to auscultation       Cardiovascular Exercise Tolerance: Good hypertension, On Medications negative cardio ROS  Rhythm:regular Rate:Normal     Neuro/Psych negative neurological ROS  negative psych ROS   GI/Hepatic Neg liver ROS,GERD  Medicated,,  Endo/Other  negative endocrine ROSdiabetes    Renal/GU Renal disease     Musculoskeletal   Abdominal   Peds  Hematology  (+) Blood dyscrasia, anemia   Anesthesia Other Findings   Reproductive/Obstetrics negative OB ROS                              Anesthesia Physical Anesthesia Plan  ASA: 3  Anesthesia Plan: MAC   Post-op Pain Management: Minimal or no pain anticipated   Induction: Intravenous  PONV Risk Score and Plan: 2  Airway Management Planned: Natural Airway and Nasal Cannula  Additional Equipment:   Intra-op Plan:   Post-operative Plan:   Informed Consent: I have reviewed the patients History and Physical, chart, labs and discussed the procedure including the risks, benefits and alternatives for the proposed anesthesia with the patient or authorized representative who has indicated his/her understanding and acceptance.     Dental Advisory Given  Plan Discussed with: CRNA  Anesthesia Plan Comments: (Patient consented for risks of anesthesia including but not limited to:  - adverse reactions to medications - damage to eyes, teeth, lips or other oral mucosa - nerve damage due to  positioning  - sore throat or hoarseness - Damage to heart, brain, nerves, lungs, other parts of body or loss of life  Patient voiced understanding.)        Anesthesia Quick Evaluation

## 2022-02-03 NOTE — H&P (Signed)
[facility]   Primary Care Physician:  Dion Body, MD Ophthalmologist: Dr. Benay Pillow  Pre-Procedure History & Physical: HPI:  Karl Wise is a 56 y.o. male here for cataract surgery.   Past Medical History:  Diagnosis Date   Anemia    Arthritis    CKD (chronic kidney disease), stage III (HCC)    Deep vein thrombosis (DVT) of left lower extremity (HCC)    GERD (gastroesophageal reflux disease)    History of blood transfusion    Hypertension    Kidney stones    mild renal insufficiency   Left leg DVT (HCC)    S/P IVC filter placement 7/10, on Xarelto   Lung cancer (HCC)    T3,N0,M0, CT-guided needle BX's positive for poorly differentiated carcinoma   Wears hearing aid in both ears     Past Surgical History:  Procedure Laterality Date   CATARACT EXTRACTION W/PHACO Right 01/20/2022   Procedure: CATARACT EXTRACTION PHACO AND INTRAOCULAR LENS PLACEMENT ([place]) RIGHT;  Surgeon: Eulogio Bear, MD;  Location: [place];  Service: Ophthalmology;  Laterality: Right;   CHOLECYSTECTOMY N/A 01/26/2015   Procedure: LAPAROSCOPIC CHOLECYSTECTOMY;  Surgeon: [name], MD;  Location: [facility];  Service: General;  Laterality: N/A;   cyst removed off of right side of neck     INGUINAL HERNIA REPAIR     port-a-cath placement     VIDEO ASSISTED THORACOSCOPY (VATS)/WEDGE RESECTION Right 09/05/2013   Procedure: VIDEO ASSISTED THORACOSCOPY (VATS); Right Thoracotomy; Right Upper Lobectomy with Node Dissection and Enblock Resection of Mediastinal Invasion and Placement of OnQ Pain Pump;  Surgeon: Grace Isaac, MD;  Location: [facility];  Service: Thoracic;  Laterality: Right;   VIDEO BRONCHOSCOPY N/A 09/05/2013   Procedure: VIDEO BRONCHOSCOPY;  Surgeon: Grace Isaac, MD;  Location: [facility];  Service: Thoracic;  Laterality: N/A;    Prior to Admission medications   Medication Sig Start Date End Date Taking? Authorizing Provider  allopurinol (ZYLOPRIM) 100 MG  tablet Take 100 mg by mouth daily.   Yes [provider]  amLODipine (NORVASC) 2.5 MG tablet Take 5 mg by mouth daily. 06/03/17  Yes [provider]  Ascorbic Acid (VITAMIN C PO) Take by mouth daily.   Yes [provider]  carvedilol (COREG) 6.25 MG tablet Take 6.25 mg by mouth.   Yes [provider]  Ergocalciferol (VITAMIN D2 PO) Take by mouth daily.   Yes [provider]  Fexofenadine HCl (ALLEGRA PO) Take 1 tablet by mouth daily as needed (allergies).   Yes [provider]  hydrALAZINE (APRESOLINE) 50 MG tablet Take 50 mg by mouth 3 (three) times daily.   Yes [provider]  losartan (COZAAR) 100 MG tablet Take 100 mg by mouth daily.  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  Tobacco Use   Smoking status: Former    Packs/day: 1.00    Years: 20.00    Total pack years: 20.00    Types: Cigarettes    Quit date: 03/02/2005    Years since quitting: 16.9   Smokeless tobacco: Never  Vaping Use   Vaping Use: Never used  Substance and Sexual Activity   Alcohol use: Yes    Comment: ocassionally   Drug use: No   Sexual activity: Not on file  Other Topics Concern   Not on file  Social History Narrative   Not on file   Social Determinants of Health   Financial Resource Strain: Not on file  Food Insecurity: Not on file   Transportation Needs: Not on file  Physical Activity: Not on file  Stress: Not on file  Social Connections: Not on file  Intimate Partner Violence: Not on file    Review of Systems: See HPI, otherwise negative ROS  Physical Exam: BP 120/85   Pulse 96   Temp 98.6 F (37 C) (Temporal)   Resp 18   Ht 5\' 6"  (1.676 m)   Wt 92.5 kg   SpO2 96%   BMI 32.93 kg/m  General:   Alert, cooperative in NAD Head:  Normocephalic and atraumatic. Respiratory:  Normal work of breathing. Cardiovascular:  RRR  Impression/Plan: Karl Wise is here for cataract surgery.  Risks, benefits, limitations, and alternatives regarding cataract surgery have been reviewed with the patient.  Questions have been answered.  All parties agreeable.   Benay Pillow, MD  02/03/2022, 10:49 AM

## 2022-02-04 ENCOUNTER — Encounter: Payer: Self-pay | Admitting: Ophthalmology

## 2022-02-07 ENCOUNTER — Encounter: Payer: Self-pay | Admitting: Ophthalmology

## 2022-02-12 NOTE — Discharge Instructions (Signed)

## 2022-02-14 ENCOUNTER — Ambulatory Visit: Payer: No Typology Code available for payment source | Admitting: General Practice

## 2022-02-14 ENCOUNTER — Encounter
Admission: RE | Disposition: A | Discharge status: Home / Self Care | Payer: Self-pay | Source: Home / Self Care | Attending: Ophthalmology

## 2022-02-14 ENCOUNTER — Ambulatory Visit
Admission: RE | Admit: 2022-02-14 | Discharge: 2022-02-14 | Disposition: A | Discharge status: Home / Self Care | Payer: No Typology Code available for payment source | Attending: Ophthalmology | Admitting: Ophthalmology

## 2022-02-14 ENCOUNTER — Encounter: Payer: Self-pay | Admitting: Ophthalmology

## 2022-02-14 ENCOUNTER — Other Ambulatory Visit: Payer: Self-pay

## 2022-02-14 DIAGNOSIS — D759 Disease of blood and blood-forming organs, unspecified: Secondary | ICD-10-CM | POA: Diagnosis not present

## 2022-02-14 DIAGNOSIS — H2512 Age-related nuclear cataract, left eye: Secondary | ICD-10-CM | POA: Insufficient documentation

## 2022-02-14 DIAGNOSIS — Z87891 Personal history of nicotine dependence: Secondary | ICD-10-CM | POA: Insufficient documentation

## 2022-02-14 DIAGNOSIS — E1136 Type 2 diabetes mellitus with diabetic cataract: Secondary | ICD-10-CM | POA: Insufficient documentation

## 2022-02-14 DIAGNOSIS — I129 Hypertensive chronic kidney disease with stage 1 through stage 4 chronic kidney disease, or unspecified chronic kidney disease: Secondary | ICD-10-CM | POA: Insufficient documentation

## 2022-02-14 DIAGNOSIS — E1122 Type 2 diabetes mellitus with diabetic chronic kidney disease: Secondary | ICD-10-CM | POA: Diagnosis not present

## 2022-02-14 DIAGNOSIS — N183 Chronic kidney disease, stage 3 unspecified: Secondary | ICD-10-CM | POA: Insufficient documentation

## 2022-02-14 DIAGNOSIS — D649 Anemia, unspecified: Secondary | ICD-10-CM | POA: Diagnosis not present

## 2022-02-14 HISTORY — PX: CATARACT EXTRACTION W/PHACO: SHX586

## 2022-02-14 SURGERY — PHACOEMULSIFICATION, CATARACT, WITH IOL INSERTION
Anesthesia: Monitor Anesthesia Care | Site: Eye | Laterality: Left

## 2022-02-14 MED ORDER — FENTANYL CITRATE (PF) 100 MCG/2ML IJ SOLN
INTRAMUSCULAR | Status: DC | PRN
  Administered 2022-02-14: 25 ug via INTRAVENOUS

## 2022-02-14 MED ORDER — ARMC OPHTHALMIC DILATING DROPS
1.0000 | OPHTHALMIC | Status: DC | PRN
  Administered 2022-02-14 (×3): 1 via OPHTHALMIC

## 2022-02-14 MED ORDER — LIDOCAINE HCL (PF) 2 % IJ SOLN
INTRAOCULAR | Status: DC | PRN
  Administered 2022-02-14: 1 mL via INTRAOCULAR

## 2022-02-14 MED ORDER — TETRACAINE HCL 0.5 % OP SOLN
1.0000 [drp] | OPHTHALMIC | Status: DC | PRN
  Administered 2022-02-14 (×3): 1 [drp] via OPHTHALMIC

## 2022-02-14 MED ORDER — MIDAZOLAM HCL 2 MG/2ML IJ SOLN
INTRAMUSCULAR | Status: DC | PRN
  Administered 2022-02-14: 2 mg via INTRAVENOUS

## 2022-02-14 MED ORDER — SIGHTPATH DOSE#1 BSS IO SOLN
INTRAOCULAR | Status: DC | PRN
  Administered 2022-02-14: 15 mL

## 2022-02-14 MED ORDER — EPHEDRINE SULFATE (PRESSORS) 50 MG/ML IJ SOLN
INTRAMUSCULAR | Status: DC | PRN
  Administered 2022-02-14 (×2): 10 mg via INTRAVENOUS

## 2022-02-14 MED ORDER — MOXIFLOXACIN HCL 0.5 % OP SOLN
OPHTHALMIC | Status: DC | PRN
  Administered 2022-02-14: .2 mL via OPHTHALMIC

## 2022-02-14 MED ORDER — SIGHTPATH DOSE#1 NA HYALUR & NA CHOND-NA HYALUR IO KIT
PACK | INTRAOCULAR | Status: DC | PRN
  Administered 2022-02-14: 1 via OPHTHALMIC

## 2022-02-14 MED ORDER — SIGHTPATH DOSE#1 BSS IO SOLN
INTRAOCULAR | Status: DC | PRN
  Administered 2022-02-14: 78 mL via OPHTHALMIC

## 2022-02-14 SURGICAL SUPPLY — 13 items
CATARACT SUITE SIGHTPATH (MISCELLANEOUS) ×1 IMPLANT
DISSECTOR HYDRO NUCLEUS 50X22 (MISCELLANEOUS) ×1 IMPLANT
FEE CATARACT SUITE SIGHTPATH (MISCELLANEOUS) ×1 IMPLANT
GLOVE SURG GAMMEX PI TX LF 7.5 (GLOVE) ×1 IMPLANT
GLOVE SURG SYN 8.5  E (GLOVE) ×1
GLOVE SURG SYN 8.5 E (GLOVE) ×1 IMPLANT
GLOVE SURG SYN 8.5 PF PI (GLOVE) ×1 IMPLANT
LENS IOL TECNIS EYHANCE 18.5 (Intraocular Lens) IMPLANT
NDL FILTER BLUNT 18X1 1/2 (NEEDLE) ×1 IMPLANT
NEEDLE FILTER BLUNT 18X1 1/2 (NEEDLE) ×1 IMPLANT
SYR 3ML LL SCALE MARK (SYRINGE) ×1 IMPLANT
SYR 5ML LL (SYRINGE) ×1 IMPLANT
WATER STERILE IRR 250ML POUR (IV SOLUTION) ×1 IMPLANT

## 2022-02-14 NOTE — Op Note (Signed)
OPERATIVE NOTE  Karl Wise [id number] 02/14/2022   PREOPERATIVE DIAGNOSIS:  Nuclear sclerotic cataract left eye.  H25.12   POSTOPERATIVE DIAGNOSIS:    Nuclear sclerotic cataract left eye.     PROCEDURE:  Phacoemusification with posterior chamber intraocular lens placement of the left eye   LENS:   Implant Name Type Inv. Item Serial No. Manufacturer Lot No. LRB No. Used Action  LENS IOL TECNIS EYHANCE 18.5 - [id number] Intraocular Lens LENS IOL TECNIS EYHANCE 18.5 [id number] [employer]  Left 1 Implanted      Procedure(s): CATARACT EXTRACTION PHACO AND INTRAOCULAR LENS PLACEMENT (IOC) LEFT  1.61  00:12.6 (Left)  DIB00 +18.5   SURGEON:  Benay Pillow, MD, MPH   ANESTHESIA:  Topical with tetracaine drops augmented with 1% preservative-free intracameral lidocaine.  ESTIMATED BLOOD LOSS: <1 mL   COMPLICATIONS:  None.   DESCRIPTION OF PROCEDURE:  The patient was identified in the holding room and transported to the operating room and placed in the supine position under the operating microscope.  The left eye was identified as the operative eye and it was prepped and draped in the usual sterile ophthalmic fashion.   A 1.0 millimeter clear-corneal paracentesis was made at the 5:00 position. 0.5 ml of preservative-free 1% lidocaine with epinephrine was injected into the anterior chamber.  The anterior chamber was filled with viscoelastic.  A 2.4 millimeter keratome was used to make a near-clear corneal incision at the 2:00 position.  A curvilinear capsulorrhexis was made with a cystotome and capsulorrhexis forceps.  Balanced salt solution was used to hydrodissect and hydrodelineate the nucleus.   Phacoemulsification was then used in stop and chop fashion to remove the lens nucleus and epinucleus.  The remaining cortex was then removed using the irrigation and aspiration handpiece. Viscoelastic was then placed into the capsular bag to distend it for lens placement.  A lens was then injected  into the capsular bag.  The remaining viscoelastic was aspirated.   Wounds were hydrated with balanced salt solution.  The anterior chamber was inflated to a physiologic pressure with balanced salt solution.  Intracameral vigamox 0.1 mL undiltued was injected into the eye and a drop placed onto the ocular surface.  No wound leaks were noted.  The patient was taken to the recovery room in stable condition without complications of anesthesia or surgery  Benay Pillow 02/14/2022, 8:48 AM

## 2022-02-14 NOTE — Anesthesia Postprocedure Evaluation (Signed)
Anesthesia Post Note  Patient: YERIEL MINEO  Procedure(s) Performed: CATARACT EXTRACTION PHACO AND INTRAOCULAR LENS PLACEMENT (IOC) LEFT  1.61  00:12.6 (Left: Eye)  Patient location during evaluation: PACU Anesthesia Type: MAC Level of consciousness: awake and alert Pain management: pain level controlled Vital Signs Assessment: post-procedure vital signs reviewed and stable Respiratory status: spontaneous breathing, nonlabored ventilation, respiratory function stable and patient connected to nasal cannula oxygen Cardiovascular status: stable and blood pressure returned to baseline Postop Assessment: no apparent nausea or vomiting Anesthetic complications: no   No notable events documented.   Last Vitals:  Vitals:   02/14/22 0851 02/14/22 0855  BP: (!) 94/58 97/68  Pulse: 67 65  Resp: 16 15  Temp: 37.3 C (!) 36.3 C  SpO2: 96% 94%    Last Pain:  Vitals:   02/14/22 0855  TempSrc:   PainSc: 0-No pain                 Ilene Qua

## 2022-02-14 NOTE — Transfer of Care (Signed)
Immediate Anesthesia Transfer of Care Note  Patient: Karl Wise  Procedure(s) Performed: CATARACT EXTRACTION PHACO AND INTRAOCULAR LENS PLACEMENT (IOC) LEFT  1.61  00:12.6 (Left: Eye)  Patient Location: PACU  Anesthesia Type: MAC  Level of Consciousness: awake, alert  and patient cooperative  Airway and Oxygen Therapy: Patient Spontanous Breathing and Patient connected to supplemental oxygen  Post-op Assessment: Post-op Vital signs reviewed, Patient's Cardiovascular Status Stable, Respiratory Function Stable, Patent Airway and No signs of Nausea or vomiting  Post-op Vital Signs: Reviewed and stable  Complications: No notable events documented.

## 2022-02-14 NOTE — Anesthesia Preprocedure Evaluation (Signed)
Anesthesia Evaluation  Patient identified by MRN, date of birth, ID band Patient awake    Reviewed: Allergy & Precautions, H&P , NPO status , Patient's Chart, lab work & pertinent test results, reviewed documented beta blocker date and time   History of Anesthesia Complications Negative for: history of anesthetic complications  Airway Mallampati: II  TM Distance: >3 FB Neck ROM: full    Dental no notable dental hx. (+) Teeth Intact   Pulmonary neg pulmonary ROS, former smoker   Pulmonary exam normal breath sounds clear to auscultation       Cardiovascular Exercise Tolerance: Good hypertension, On Medications negative cardio ROS  Rhythm:regular Rate:Normal     Neuro/Psych negative neurological ROS  negative psych ROS   GI/Hepatic Neg liver ROS,GERD  Medicated,,  Endo/Other  negative endocrine ROSdiabetes    Renal/GU Renal disease     Musculoskeletal   Abdominal   Peds  Hematology  (+) Blood dyscrasia, anemia   Anesthesia Other Findings   Reproductive/Obstetrics negative OB ROS                              Anesthesia Physical Anesthesia Plan  ASA: 3  Anesthesia Plan: MAC   Post-op Pain Management: Minimal or no pain anticipated   Induction: Intravenous  PONV Risk Score and Plan: 2  Airway Management Planned: Natural Airway and Nasal Cannula  Additional Equipment:   Intra-op Plan:   Post-operative Plan:   Informed Consent: I have reviewed the patients History and Physical, chart, labs and discussed the procedure including the risks, benefits and alternatives for the proposed anesthesia with the patient or authorized representative who has indicated his/her understanding and acceptance.     Dental Advisory Given  Plan Discussed with: CRNA  Anesthesia Plan Comments: (Patient consented for risks of anesthesia including but not limited to:  - adverse reactions to  medications - damage to eyes, teeth, lips or other oral mucosa - nerve damage due to positioning  - sore throat or hoarseness - Damage to heart, brain, nerves, lungs, other parts of body or loss of life  Patient voiced understanding.)        Anesthesia Quick Evaluation

## 2022-02-14 NOTE — H&P (Signed)
[facility]   Primary Care Physician:  Dion Body, MD Ophthalmologist: Dr. Benay Pillow  Pre-Procedure History & Physical: HPI:  Karl Wise is a 56 y.o. male here for cataract surgery.   Past Medical History:  Diagnosis Date   Anemia    Arthritis    CKD (chronic kidney disease), stage III (HCC)    Deep vein thrombosis (DVT) of left lower extremity (HCC)    GERD (gastroesophageal reflux disease)    History of blood transfusion    Hypertension    Kidney stones    mild renal insufficiency   Left leg DVT (HCC)    S/P IVC filter placement 7/10, on Xarelto   Lung cancer (HCC)    T3,N0,M0, CT-guided needle BX's positive for poorly differentiated carcinoma   Wears hearing aid in both ears     Past Surgical History:  Procedure Laterality Date   CATARACT EXTRACTION W/PHACO Right 01/20/2022   Procedure: CATARACT EXTRACTION PHACO AND INTRAOCULAR LENS PLACEMENT ([place]) RIGHT;  Surgeon: Eulogio Bear, MD;  Location: [place];  Service: Ophthalmology;  Laterality: Right;   CATARACT EXTRACTION W/PHACO Right 02/03/2022   Procedure: CATARACT EXTRACTION PHACO AND INTRAOCULAR LENS PLACEMENT (IOC) RIGHT  2.03  00:20.0;  Surgeon: Eulogio Bear, MD;  Location: [place];  Service: Ophthalmology;  Laterality: Right;   CHOLECYSTECTOMY N/A 01/26/2015   Procedure: LAPAROSCOPIC CHOLECYSTECTOMY;  Surgeon: [name], MD;  Location: [facility];  Service: General;  Laterality: N/A;   cyst removed off of right side of neck     INGUINAL HERNIA REPAIR     port-a-cath placement     VIDEO ASSISTED THORACOSCOPY (VATS)/WEDGE RESECTION Right 09/05/2013   Procedure: VIDEO ASSISTED THORACOSCOPY (VATS); Right Thoracotomy; Right Upper Lobectomy with Node Dissection and Enblock Resection of Mediastinal Invasion and Placement of OnQ Pain Pump;  Surgeon: Grace Isaac, MD;  Location: [place];  Service: Thoracic;  Laterality: Right;   VIDEO BRONCHOSCOPY N/A 09/05/2013    Procedure: VIDEO BRONCHOSCOPY;  Surgeon: Grace Isaac, MD;  Location: [facility];  Service: Thoracic;  Laterality: N/A;    Prior to Admission medications   Medication Sig Start Date End Date Taking? Authorizing Provider  allopurinol (ZYLOPRIM) 100 MG tablet Take 100 mg by mouth daily.   Yes [provider]  amLODipine (NORVASC) 2.5 MG tablet Take 5 mg by mouth daily. 06/03/17  Yes [provider]  Ascorbic Acid (VITAMIN C PO) Take by mouth daily.   Yes [provider]  carvedilol (COREG) 6.25 MG tablet Take 6.25 mg by mouth.   Yes [provider]  Ergocalciferol (VITAMIN D2 PO) Take by mouth daily.   Yes [provider]  Fexofenadine HCl (ALLEGRA PO) Take 1 tablet by mouth daily as needed (allergies).   Yes [provider]  hydrALAZINE (APRESOLINE) 50 MG tablet Take 50 mg by mouth 3 (three) times daily.   Yes [provider]  losartan (COZAAR) 100 MG tablet Take 100 mg by mouth daily.  10/29/16  Yes [provider]    Allergies as of 02/06/2022 - Review Complete 02/03/2022  Allergen Reaction Noted   Emend [aprepitant] Anaphylaxis 06/30/2013   Paclitaxel Anaphylaxis 06/30/2013    Family History  Problem Relation Age of Onset   Diabetes type II Mother    Hypertension Mother    Diabetes type II Father    Hypertension Father     Social History   Socioeconomic History   Marital status: Divorced  Spouse name: Not on file   Number of children: Not on file   Years of education: Not on file   Highest education level: Not on file  Occupational History   Occupation: [employer]    Comment: salesman  Tobacco Use   Smoking status: Former    Packs/day: 1.00    Years: 20.00    Total pack years: 20.00    Types: Cigarettes    Quit date: 03/02/2005    Years since quitting: 16.9   Smokeless tobacco: Never  Vaping Use   Vaping Use: Never used  Substance and Sexual Activity   Alcohol use: Yes    Comment:  ocassionally   Drug use: No   Sexual activity: Not on file  Other Topics Concern   Not on file  Social History Narrative   Not on file   Social Determinants of Health   Financial Resource Strain: Not on file  Food Insecurity: Not on file  Transportation Needs: Not on file  Physical Activity: Not on file  Stress: Not on file  Social Connections: Not on file  Intimate Partner Violence: Not on file    Review of Systems: See HPI, otherwise negative ROS  Physical Exam: BP 107/72   Temp 98.2 F (36.8 C) (Tympanic)   Resp 18   Ht 5' 5.98" (1.676 m)   Wt 90.9 kg   SpO2 95%   BMI 32.36 kg/m  General:   Alert, cooperative in NAD Head:  Normocephalic and atraumatic. Respiratory:  Normal work of breathing. Cardiovascular:  RRR  Impression/Plan: Karl Wise is here for cataract surgery.  Risks, benefits, limitations, and alternatives regarding cataract surgery have been reviewed with the patient.  Questions have been answered.  All parties agreeable.   Benay Pillow, MD  02/14/2022, 8:21 AM

## 2022-02-18 ENCOUNTER — Encounter: Payer: Self-pay | Admitting: Ophthalmology

## 2022-03-14 ENCOUNTER — Encounter
Admission: EM | Disposition: E | Payer: Self-pay | Source: Home / Self Care | Attending: Student in an Organized Health Care Education/Training Program

## 2022-03-14 ENCOUNTER — Emergency Department
Admission: EM | Admit: 2022-03-14 | Discharge: 2022-03-27 | Disposition: E | Payer: No Typology Code available for payment source | Attending: Student in an Organized Health Care Education/Training Program | Admitting: Student in an Organized Health Care Education/Training Program

## 2022-03-14 DIAGNOSIS — I469 Cardiac arrest, cause unspecified: Secondary | ICD-10-CM | POA: Insufficient documentation

## 2022-03-14 DIAGNOSIS — I213 ST elevation (STEMI) myocardial infarction of unspecified site: Secondary | ICD-10-CM

## 2022-03-14 SURGERY — CORONARY/GRAFT ACUTE MI REVASCULARIZATION
Anesthesia: Moderate Sedation

## 2022-03-14 MED ORDER — VERAPAMIL HCL 2.5 MG/ML IV SOLN
INTRAVENOUS | Status: AC
  Filled 2022-03-14: qty 2

## 2022-03-14 MED ORDER — NOREPINEPHRINE 4 MG/250ML-% IV SOLN
INTRAVENOUS | Status: AC
  Filled 2022-03-14: qty 250

## 2022-03-14 MED ORDER — ETOMIDATE 2 MG/ML IV SOLN
20.0000 mg | Freq: Once | INTRAVENOUS | Status: AC
  Administered 2022-03-14: 20 mg via INTRAVENOUS

## 2022-03-14 MED ORDER — ROCURONIUM BROMIDE 50 MG/5ML IV SOLN
100.0000 mg | Freq: Once | INTRAVENOUS | Status: AC
  Administered 2022-03-14: 100 mg via INTRAVENOUS

## 2022-03-27 NOTE — ED Provider Notes (Signed)
[facility] Provider Note    Event Date/Time   First MD Initiated Contact with Patient 03-17-22 0945     (approximate)   History   No chief complaint on file.   HPI  Karl Wise is a 57 y.o. male presents to the ER for evaluation of chest pain and STEMI.  Arrives with CPR in progress.  Reportedly was having some chest pain shortness of breath diaphoretic and passed out.  EMS found patient unresponsive.  Postarrest they were able to briefly get ROSC and patient was found to have STEMI.  Eportedly had PEA arrest given multiple rounds of epinephrine followed by asystole.  Ventilations being provided by bag-valve-mask on arrival.  Episode occurred around 950.  More than 30 minutes of CPR provided upon arrival to the ER.     Physical Exam   Triage Vital Signs: ED Triage Vitals  Enc Vitals Group     BP      Pulse      Resp      Temp      Temp src      SpO2      Weight      Height      Head Circumference      Peak Flow      Pain Score      Pain Loc      Pain Edu?      Excl. in GC?     Most recent vital signs: There were no vitals filed for this visit.   Constitutional: gcs 3 Eyes: Pupils fixed and dilated Head: Atraumatic. Nose: No congestion/rhinnorhea. Mouth/Throat: Mucous membranes are moist.   Cardiovascular: Pulseless Respiratory: Apneic Gastrointestinal: No distention Musculoskeletal:  no deformity Neurologic: GCS 3 no gag   ED Results / Procedures / Treatments   Labs (all labs ordered are listed, but only abnormal results are displayed) Labs Reviewed - No data to display   EKG     RADIOLOGY   EMERGENCY DEPARTMENT [place] CARDIAC EXAM "Study: Limited Ultrasound of the Heart and Pericardium"  INDICATIONS:Cardiac arrest Multiple views of the heart and pericardium were obtained in real-time with a multi-frequency probe.  PERFORMED GY:IRSWNI IMAGES ARCHIVED?: No LIMITATIONS:  Emergent procedure VIEWS USED: Parasternal  short axis INTERPRETATION: Cardiac tamponade absent and Agonal contractions noted    PROCEDURES:   Procedure Name: Intubation Date/Time: 2022/03/17 10:05 AM  Performed by: Willy Eddy, MDPre-anesthesia Checklist: Patient identified, Patient being monitored, Emergency Drugs available, Timeout performed and Suction available Oxygen Delivery Method: Non-rebreather mask Preoxygenation: Pre-oxygenation with 100% oxygen Induction Type: Rapid sequence Ventilation: Mask ventilation without difficulty Laryngoscope Size: Glidescope and 4 Tube size: 7.5 mm Number of attempts: 1 Airway Equipment and Method: Video-laryngoscopy Placement Confirmation: ETT inserted through vocal cords under direct vision, CO2 detector and Breath sounds checked- equal and bilateral Secured at: 21 cm Tube secured with: ETT holder       MEDICATIONS ORDERED IN ED: Medications  norepinephrine (LEVOPHED) 4-5 MG/250ML-% infusion SOLN (has no administration in time range)     IMPRESSION / MDM / ASSESSMENT AND PLAN / ED COURSE  I reviewed the triage vital signs and the nursing notes.                              Differential diagnosis includes, but is not limited to, dysrhythmia, ACS, STEMI, PE, pneumothorax, electrolyte abnormality  Patient arrives in cardiac arrest.  Interventional cardiology at bedside as  the patient was called code STEMI and route.  Intubated for airway protection without complication.  Patient had no gag reflex pupils pinpoint.  Concern for prolonged downtime anoxic brain injury.  ACLS protocol followed.   Clinical Course as of 15-Mar-2022 1005  Fri 03/15/22  [id number] Despite resuscitative efforts we are unable to obtain sustainable ROSC.  This was discussed with the wife at bedside and due to significant prolonged downtime and high suspicion for significant anoxic brain injury at this point further resuscitative efforts deemed futile..  Time of death 9:44. [PR]    Clinical Course  User Index [PR] Willy Eddy, MD      FINAL CLINICAL IMPRESSION(S) / ED DIAGNOSES   Final diagnoses:  Cardiac arrest (HCC)  ST elevation myocardial infarction (STEMI), unspecified artery (HCC)     Rx / DC Orders   ED Discharge Orders     None        Note:  This document was prepared using Dragon voice recognition software and may include unintentional dictation errors.    Willy Eddy, MD 03/15/22 1006

## 2022-03-27 NOTE — ED Notes (Signed)
0923-Pt arrives with CPR in progress. Per EMS called out for chest pain, police arrived pt became unresponsive, CPR initiated. EMS states ROSC obtained, I/O placed RLE, NS pt bradycardic then lost pulse CPR resumed. Samuel Bouche in place compressing, pt placed on stretcher.  0925 20 MG Etomidate and 100 mg Roc by [employer], 7.5 ett placed by MD Roxan Hockey. 2863- PEA CPR resumed 0930-EPI given, 20 LAC obtained  0931- Atropine amp given  0932- MD Roxan Hockey states unorganized rhythm. 20G Right AC EPI given 0934- Weak pulse, unorganized rhythm per MD Roxan Hockey [phone]- Compression resumed, EPI Given  0940-Wife Alvino Chapel to bedside speaking with MD states to withdraw care.  0944-Pronounced by MD Roxan Hockey

## 2022-03-27 DEATH — deceased
